# Patient Record
Sex: Female | Born: 1945 | Race: White | Hispanic: No | Smoking: Never smoker
Health system: Southern US, Community
[De-identification: ages and names within clinical notes are randomized; demographics above are authoritative.]

## PROBLEM LIST (undated history)

## (undated) DIAGNOSIS — I10 Essential (primary) hypertension: Secondary | ICD-10-CM

## (undated) DIAGNOSIS — N189 Chronic kidney disease, unspecified: Secondary | ICD-10-CM

## (undated) DIAGNOSIS — K219 Gastro-esophageal reflux disease without esophagitis: Secondary | ICD-10-CM

## (undated) DIAGNOSIS — M1990 Unspecified osteoarthritis, unspecified site: Secondary | ICD-10-CM

## (undated) HISTORY — PX: COLONOSCOPY: SHX174

## (undated) HISTORY — PX: EYE SURGERY: SHX253

## (undated) HISTORY — PX: CHOLECYSTECTOMY: SHX55

## (undated) HISTORY — DX: Essential (primary) hypertension: I10

## (undated) HISTORY — PX: ANKLE SURGERY: SHX546

## (undated) HISTORY — DX: Gastro-esophageal reflux disease without esophagitis: K21.9

## (undated) HISTORY — DX: Unspecified osteoarthritis, unspecified site: M19.90

## (undated) HISTORY — PX: DILATION AND CURETTAGE OF UTERUS: SHX78

---

## 2004-12-21 ENCOUNTER — Ambulatory Visit: Payer: Self-pay | Admitting: Family Medicine

## 2007-07-29 ENCOUNTER — Ambulatory Visit: Payer: Self-pay | Admitting: Physician Assistant

## 2008-01-29 ENCOUNTER — Ambulatory Visit: Payer: Self-pay | Admitting: Gastroenterology

## 2008-03-08 ENCOUNTER — Ambulatory Visit: Payer: Self-pay | Admitting: Family Medicine

## 2008-03-15 ENCOUNTER — Encounter: Payer: Self-pay | Admitting: Family Medicine

## 2008-03-31 ENCOUNTER — Encounter: Payer: Self-pay | Admitting: Family Medicine

## 2008-09-01 ENCOUNTER — Ambulatory Visit: Payer: Self-pay | Admitting: Pain Medicine

## 2008-09-16 ENCOUNTER — Ambulatory Visit: Payer: Self-pay | Admitting: Pain Medicine

## 2008-10-12 ENCOUNTER — Ambulatory Visit: Payer: Self-pay | Admitting: Physician Assistant

## 2008-11-10 ENCOUNTER — Ambulatory Visit: Payer: Self-pay | Admitting: Physician Assistant

## 2011-09-14 ENCOUNTER — Emergency Department: Payer: Self-pay | Admitting: Emergency Medicine

## 2014-04-29 ENCOUNTER — Ambulatory Visit: Payer: Self-pay | Admitting: Internal Medicine

## 2014-10-13 ENCOUNTER — Encounter: Payer: Self-pay | Admitting: *Deleted

## 2014-10-25 ENCOUNTER — Ambulatory Visit: Payer: Self-pay | Admitting: General Surgery

## 2014-11-02 ENCOUNTER — Encounter: Payer: Commercial Managed Care - HMO | Admitting: General Surgery

## 2014-11-02 ENCOUNTER — Encounter: Payer: Self-pay | Admitting: General Surgery

## 2014-11-02 NOTE — Progress Notes (Deleted)
Patient ID: NEESA KEITH, female   DOB: 1946-07-05, 69 y.o.   MRN: [id number]  No chief complaint on file.   HPI MAYUKHA GURULE is a 69 y.o. female.   HPI  No past medical history on file.  No past surgical history on file.  No family history on file.  Social History History  Substance Use Topics  . Smoking status: Not on file  . Smokeless tobacco: Not on file  . Alcohol Use: Not on file    Allergies not on file  Current Outpatient Prescriptions  Medication Sig Dispense Refill  . aspirin 81 MG tablet Take 81 mg by mouth daily.    . calcium-vitamin D 250-100 MG-UNIT per tablet Take 1 tablet by mouth 2 (two) times daily.    Marland Kitchen lisinopril-hydrochlorothiazide (PRINZIDE,ZESTORETIC) 20-25 MG per tablet Take 1 tablet by mouth daily.    . ranitidine (ZANTAC) 300 MG tablet Take 300 mg by mouth at bedtime.     No current facility-administered medications for this visit.    Review of Systems Review of Systems    There were no vitals taken for this visit.  Physical Exam Physical Exam  Data Reviewed   Assessment        Plan           Carson Myrtle 11/02/2014, 11:12 AM

## 2014-11-02 NOTE — Progress Notes (Signed)
Patient ID: Breanna Lawson, female   DOB: July 06, 1946, 69 y.o.   MRN: [id number]

## 2014-11-10 ENCOUNTER — Ambulatory Visit (INDEPENDENT_AMBULATORY_CARE_PROVIDER_SITE_OTHER): Payer: Commercial Managed Care - HMO | Admitting: General Surgery

## 2014-11-10 ENCOUNTER — Encounter: Payer: Self-pay | Admitting: General Surgery

## 2014-11-10 VITALS — BP 130/74 | HR 76 | Resp 16 | Ht 63.0 in | Wt 274.0 lb

## 2014-11-10 DIAGNOSIS — K439 Ventral hernia without obstruction or gangrene: Secondary | ICD-10-CM

## 2014-11-10 DIAGNOSIS — M79604 Pain in right leg: Secondary | ICD-10-CM | POA: Diagnosis not present

## 2014-11-10 NOTE — Progress Notes (Signed)
Patient ID: Breanna Lawson, female   DOB: August 25, 1946, 69 y.o.   MRN: [id number]  Chief Complaint  Patient presents with  . Other    Epigastric hernia    HPI Breanna Lawson is a 69 y.o. female here today for a evaluation of an umbilical hernia. Patient states the area has been there for 5 years but has gotten bigger in recent months. The area does not cause any pain. Patient also states she has been having some abdominal pain after eating and states she feels nauseated. No vomiting. No changes in bowel habits. Patient also complains of pain in her right leg for over a year. The pain occurs when walking and is relieved by lying flat. At times when she is sitting she experiences pain and has to lift the leg up onto a chair.  Patient has noticed some swelling in the leg after standing on her feet for extended periods of time. Last colonoscopy was more than 10 years ago. She states she was supposed to have one in 2012.  HPI  Past Medical History  Diagnosis Date  . Hypertension   . GERD (gastroesophageal reflux disease)   . Arthritis     Past Surgical History  Procedure Laterality Date  . Colonoscopy      more than 10 years ago.   . Ankle surgery Left   . Cholecystectomy      Family History  Problem Relation Age of Onset  . Heart failure Father   . Heart attack Mother   . Breast cancer Sister     Social History History  Substance Use Topics  . Smoking status: Never Smoker   . Smokeless tobacco: Never Used  . Alcohol Use: No    No Known Allergies  Current Outpatient Prescriptions  Medication Sig Dispense Refill  . aspirin 81 MG tablet Take 81 mg by mouth daily.    . calcium-vitamin D 250-100 MG-UNIT per tablet Take 1 tablet by mouth 2 (two) times daily.    Marland Kitchen lisinopril-hydrochlorothiazide (PRINZIDE,ZESTORETIC) 20-25 MG per tablet Take 1 tablet by mouth daily.    . Loperamide HCl (ANTI-DIARRHEAL PO) Take by mouth as needed.    . ranitidine (ZANTAC) 300 MG tablet Take 300 mg  by mouth at bedtime.     No current facility-administered medications for this visit.    Review of Systems Review of Systems  Constitutional: Negative.   Respiratory: Negative.   Cardiovascular: Positive for leg swelling. Negative for chest pain and palpitations.  Gastrointestinal: Positive for nausea and abdominal pain. Negative for vomiting, constipation, blood in stool, abdominal distention, anal bleeding and rectal pain.    Blood pressure 130/74, pulse 76, resp. rate 16, height _0  (1.6 m), weight 274 lb (124.286 kg).  Physical Exam Physical Exam  Constitutional: She is oriented to person, place, and time. She appears well-developed and well-nourished.  Obese white female, finds it difficult to lie flat.  Eyes: Conjunctivae are normal. No scleral icterus.  Neck: Neck supple. No thyromegaly present.  Cardiovascular: Normal rate, regular rhythm, normal heart sounds and intact distal pulses.   Pulses:      Dorsalis pedis pulses are 2+ on the right side, and 2+ on the left side.       Posterior tibial pulses are 2+ on the right side, and 2+ on the left side.  No apparent edema in legs, no varicose veins seen, no skin changes.  Pulmonary/Chest: Effort normal and breath sounds normal.  Abdominal: Soft. Normal appearance and  bowel sounds are normal. There is no tenderness. A hernia is present.    Lymphadenopathy:    She has no cervical adenopathy.  Neurological: She is alert and oriented to person, place, and time.  Skin: Skin is warm and dry.    Data Reviewed None  Assessment    Leg pain likely neuromuscular. Needs orthopedic consult.  Sizable ventral hernia. Patient is at risk for surgery due to weight. Extent of hernia best assessed by CT scan.     Plan    Patient has been scheduled for a CT abdomen/pelvis with contrast at [facility] for 11-16-14 at 11:30 am (arrive 11:15 am). Prep: no solids 4 hours prior but patient may have clear liquids up until  exam time, pick up prep kit, and take medication list. Patient verbalizes understanding.          Payton Moder G 11/11/2014, 5:35 AM

## 2014-11-10 NOTE — Patient Instructions (Addendum)
Schedule for CT to assess ventral hernia.  Patient has been scheduled for a CT abdomen/pelvis with contrast at [facility] for 11-16-14 at 11:30 am (arrive 11:15 am). Prep: no solids 4 hours prior but patient may have clear liquids up until exam time, pick up prep kit, and take medication list. Patient verbalizes understanding.

## 2014-11-11 ENCOUNTER — Encounter: Payer: Self-pay | Admitting: General Surgery

## 2014-11-18 ENCOUNTER — Ambulatory Visit: Payer: Commercial Managed Care - HMO | Admitting: General Surgery

## 2014-11-19 ENCOUNTER — Ambulatory Visit: Payer: Self-pay | Admitting: General Surgery

## 2014-11-19 DIAGNOSIS — K429 Umbilical hernia without obstruction or gangrene: Secondary | ICD-10-CM | POA: Diagnosis not present

## 2014-11-19 DIAGNOSIS — N2889 Other specified disorders of kidney and ureter: Secondary | ICD-10-CM | POA: Diagnosis not present

## 2014-11-19 DIAGNOSIS — K439 Ventral hernia without obstruction or gangrene: Secondary | ICD-10-CM | POA: Diagnosis not present

## 2014-11-22 ENCOUNTER — Encounter: Payer: Self-pay | Admitting: General Surgery

## 2014-11-22 ENCOUNTER — Ambulatory Visit (INDEPENDENT_AMBULATORY_CARE_PROVIDER_SITE_OTHER): Payer: Commercial Managed Care - HMO | Admitting: General Surgery

## 2014-11-22 VITALS — BP 130/72 | HR 74 | Resp 14 | Ht 63.0 in | Wt 274.0 lb

## 2014-11-22 DIAGNOSIS — K439 Ventral hernia without obstruction or gangrene: Secondary | ICD-10-CM

## 2014-11-22 NOTE — Patient Instructions (Addendum)
Patient to return in three months. Discussed weight lost.  Laparoscopic Ventral Hernia Repair Laparoscopic ventral hernia repairis a surgery to fix a ventral hernia. Aventral hernia, also called an incisional hernia, is a bulge of body tissue or intestines that pushes through the front part of the abdomen. This can happen if the connective tissue covering the muscles over the abdomen has a weak spot or is torn because of a surgical cut (incision) from a previous surgery. Laparoscopic ventral hernia repair is often done soon after diagnosis to stop the hernia from getting bigger, becoming uncomfortable, or becoming an emergency. This surgery usually takes about 2 hours, but the time can vary greatly. LET [facility] CARE PROVIDER KNOW ABOUT:  Any allergies you have.  All medicines you are taking, including steroids, vitamins, herbs, eye drops, creams, and over-the-counter medicines.  Previous problems you or members of your family have had with the use of anesthetics.  Any blood disorders you have.  Previous surgeries you have had.  Medical conditions you have. RISKS AND COMPLICATIONS  Generally, laparoscopic ventral hernia repair is a safe procedure. However, as with any surgical procedure, problems can occur. Possible problems include:  Bleeding.  Trouble passing urine or having a bowel movement after the surgery.  Infection.  Pneumonia.  Blood clots.  Pain in the area of the hernia.  A bulge in the area of the hernia that may be caused by a collection of fluid.  Injury to intestines or other structures in the abdomen.  Return of the hernia after surgery. In some cases, your health care provider may need to stop the laparoscopic procedure and do regular, open surgery. This may be necessary for very difficult hernias, when organs are hard to see, or when bleeding problems occur during surgery. BEFORE THE PROCEDURE   You may need to have blood tests, urine tests, a chest  X-ray, or an electrocardiogram done before the day of the surgery.  Ask your health care provider about changing or stopping your regular medicines. This is especially important if you are taking diabetes medicines or blood thinners.  You may need to wash with a special type of germ-killing soap.  Do not eat or drink anything after midnight the night before the procedure or as directed by your health care provider.  Make plans to have someone drive you home after the procedure. PROCEDURE   Small monitors will be put on your body. They are used to check your heart, blood pressure, and oxygen level.  An IV access tube will be put into a vein in your hand or arm. Fluids and medicine will flow directly into your body through the IV tube.  You will be given medicine that makes you go to sleep (general anesthetic).  Your abdomen will be cleaned with a special soap to kill any germs on your skin.  Once you are asleep, several small incisions will be made in your abdomen.  The large space in your abdomen will be filled with air so that it expands. This gives your health care provider more room and a better view.  A thin, lighted tube with a tiny camera on the end (laparoscope) is put through a small incision in your abdomen. The camera on the laparoscope sends a picture to a TV screen in the operating room. This gives your health care provider a good view inside your abdomen.  Hollow tubes are put through the other small incisions in your abdomen. The tools needed for the procedure  are put through these tubes.  Your health care provider puts the tissue or intestines that formed the hernia back in place.  A screen-like patch (mesh) is used to close the hernia. This helps make the area stronger. Stitches, tacks, or staples are used to keep the mesh in place.  Medicine and a bandage (dressing) or skin glue will be put over the incisions. AFTER THE PROCEDURE   You will stay in a recovery area  until the anesthetic wears off. Your blood pressure and pulse will be checked often.  You may be able to go home the same day or may need to stay in the hospital for 1-2 days after surgery. Your health care provider will decide when you can go home.  You may feel some pain. You may be given medicine for pain.  You will be urged to do breathing exercises that involve taking deep breaths. This helps prevent a lung infection after a surgery.  You may have to wear compression stockings while you are in the hospital. These stockings help keep blood clots from forming in your legs. Document Released: 09/03/2012 Document Revised: 09/22/2013 Document Reviewed: 09/03/2012 [facility] Patient Information 2015 [place], [place]. This information is not intended to replace advice given to you by your health care provider. Make sure you discuss any questions you have with your health care provider.

## 2014-11-22 NOTE — Progress Notes (Signed)
This is a 69 year old female here today for her results from her CT scan that was done on 11/19/14.  CT scan was reviewed. There are 2 5cm diuam fascial defects in central abdomen-above and below umbilicus, this is a narrow bridge of fascia, There is a loop of tr colon in upper hernia and fat in lower hernia. Overall she has a 10 by 5-6cm fascial defect.  Pt weighs 275lbs. Exam shoiwd the 2 hernias basically unchanged from last eval. Abd is soft, nontender. Based on exam and CT findings best way to repair the hernia is with planned component separatin and retrorectus mesh placement.  This is an involved procedure and pt's weight is a siginificant ris factor. At present she has no obsructive symptoms. Discussed in detail with pt and advised  On need to lose wight.  Plan is to follow periodically while she is losing weight. She can discuss with Dr. Lavera Guise regarding an adequate wt loss regimen.   All of the above dicussed in full with pt.   Patient to return in three months.   Cc: PCP

## 2014-12-16 DIAGNOSIS — M1990 Unspecified osteoarthritis, unspecified site: Secondary | ICD-10-CM | POA: Diagnosis not present

## 2014-12-16 DIAGNOSIS — E669 Obesity, unspecified: Secondary | ICD-10-CM | POA: Diagnosis not present

## 2015-01-17 DIAGNOSIS — M5431 Sciatica, right side: Secondary | ICD-10-CM | POA: Diagnosis not present

## 2015-01-17 DIAGNOSIS — K439 Ventral hernia without obstruction or gangrene: Secondary | ICD-10-CM | POA: Diagnosis not present

## 2015-01-17 DIAGNOSIS — M79606 Pain in leg, unspecified: Secondary | ICD-10-CM | POA: Diagnosis not present

## 2015-01-17 DIAGNOSIS — E780 Pure hypercholesterolemia: Secondary | ICD-10-CM | POA: Diagnosis not present

## 2015-01-25 DIAGNOSIS — M5417 Radiculopathy, lumbosacral region: Secondary | ICD-10-CM | POA: Diagnosis not present

## 2015-01-25 DIAGNOSIS — M4696 Unspecified inflammatory spondylopathy, lumbar region: Secondary | ICD-10-CM | POA: Diagnosis not present

## 2015-02-24 ENCOUNTER — Encounter: Payer: Self-pay | Admitting: General Surgery

## 2015-02-24 ENCOUNTER — Ambulatory Visit (INDEPENDENT_AMBULATORY_CARE_PROVIDER_SITE_OTHER): Payer: Commercial Managed Care - HMO | Admitting: General Surgery

## 2015-02-24 VITALS — BP 142/80 | HR 68 | Resp 18 | Ht 63.0 in | Wt 258.0 lb

## 2015-02-24 DIAGNOSIS — K439 Ventral hernia without obstruction or gangrene: Secondary | ICD-10-CM | POA: Diagnosis not present

## 2015-02-24 NOTE — Patient Instructions (Addendum)
The patient is aware to call back for any questions or concerns.  Ventral Hernia A ventral hernia (also called an incisional hernia) is a hernia that occurs at the site of a previous surgical cut (incision) in the abdomen. The abdominal wall spans from your lower chest down to your pelvis. If the abdominal wall is weakened from a surgical incision, a hernia can occur. A hernia is a bulge of bowel or muscle tissue pushing out on the weakened part of the abdominal wall. Ventral hernias can get bigger from straining or lifting. Obese and older people are at higher risk for a ventral hernia. People who develop infections after surgery or require repeat incisions at the same site on the abdomen are also at increased risk. CAUSES  A ventral hernia occurs because of weakness in the abdominal wall at an incision site.  SYMPTOMS  Common symptoms include:  A visible bulge or lump on the abdominal wall.  Pain or tenderness around the lump.  Increased discomfort if you cough or make a sudden movement. If the hernia has blocked part of the intestine, a serious complication can occur (incarcerated or strangulated hernia). This can become a problem that requires emergency surgery because the blood flow to the blocked intestine may be cut off. Symptoms may include:  Feeling sick to your stomach (nauseous).  Throwing up (vomiting).  Stomach swelling (distention) or bloating.  Fever.  Rapid heartbeat. DIAGNOSIS  Your health care provider will take a medical history and perform a physical exam. Various tests may be ordered, such as:  Blood tests.  Urine tests.  Ultrasonography.  X-rays.  Computed tomography (CT). TREATMENT  Watchful waiting may be all that is needed for a smaller hernia that does not cause symptoms. Your health care provider may recommend the use of a supportive belt (truss) that helps to keep the abdominal wall intact. For larger hernias or those that cause pain, surgery to  repair the hernia is usually recommended. If a hernia becomes strangulated, emergency surgery needs to be done right away. HOME CARE INSTRUCTIONS  Avoid putting pressure or strain on the abdominal area.  Avoid heavy lifting.  Use good body positioning for physical tasks. Ask your health care provider about proper body positioning.  Use a supportive belt as directed by your health care provider.  Maintain a healthy weight.  Eat foods that are high in fiber, such as whole grains, fruits, and vegetables. Fiber helps prevent difficult bowel movements (constipation).  Drink enough fluids to keep your urine clear or pale yellow.  Follow up with your health care provider as directed. SEEK MEDICAL CARE IF:   Your hernia seems to be getting larger or more painful. SEEK IMMEDIATE MEDICAL CARE IF:   You have abdominal pain that is sudden and sharp.  Your pain becomes severe.  You have repeated vomiting.  You are sweating a lot.  You notice a rapid heartbeat.  You develop a fever. MAKE SURE YOU:   Understand these instructions.  Will watch your condition.  Will get help right away if you are not doing well or get worse. Document Released: 09/03/2012 Document Revised: 02/01/2014 Document Reviewed: 09/03/2012 [facility] Patient Information 2015 [place], [place]. This information is not intended to replace advice given to you by your health care provider. Make sure you discuss any questions you have with your health care provider.  Lab work today.   Will call patient to arrange surgery once Dr. Jamal Collin reviews labs.

## 2015-02-24 NOTE — Progress Notes (Signed)
Patient ID: Breanna Lawson, female   DOB: 09-19-1946, 69 y.o.   MRN: [id number]  Chief Complaint  Patient presents with  . Follow-up    ventral hernia    HPI Breanna Lawson is a 69 y.o. female here today for her three month follow up ventral hernia. Patient states she has occasional pains maybe once or twice a week.  She is wearing her back brace. She has lost 19 pounds according to her scales since last visit.  HPI  Past Medical History  Diagnosis Date  . Hypertension   . GERD (gastroesophageal reflux disease)   . Arthritis     Past Surgical History  Procedure Laterality Date  . Colonoscopy      more than 10 years ago.   . Ankle surgery Left   . Cholecystectomy      Family History  Problem Relation Age of Onset  . Heart failure Father   . Heart attack Mother   . Breast cancer Sister     Social History History  Substance Use Topics  . Smoking status: Never Smoker   . Smokeless tobacco: Never Used  . Alcohol Use: No    No Known Allergies  Current Outpatient Prescriptions  Medication Sig Dispense Refill  . aspirin 81 MG tablet Take 81 mg by mouth daily.    . calcium-vitamin D 250-100 MG-UNIT per tablet Take 1 tablet by mouth 2 (two) times daily.    Marland Kitchen lisinopril-hydrochlorothiazide (PRINZIDE,ZESTORETIC) 20-25 MG per tablet Take 1 tablet by mouth daily.    . Loperamide HCl (ANTI-DIARRHEAL PO) Take by mouth as needed.    . meloxicam (MOBIC) 15 MG tablet     . ranitidine (ZANTAC) 300 MG tablet Take 300 mg by mouth at bedtime.     No current facility-administered medications for this visit.    Review of Systems Review of Systems  Constitutional: Negative.   Respiratory: Negative.   Cardiovascular: Negative.   Gastrointestinal: Negative for diarrhea and constipation.    Blood pressure 142/80, pulse 68, resp. rate 18, height 5' 3"  (1.6 m), weight 258 lb (117.028 kg).  Physical Exam Physical Exam  Constitutional: She is oriented to person, place, and time.  She appears well-developed and well-nourished.  Eyes: Conjunctivae are normal. No scleral icterus.  Neck: Neck supple.  Cardiovascular: Normal rate, regular rhythm and normal heart sounds.   Pulmonary/Chest: Effort normal and breath sounds normal.  Abdominal: Soft. Normal appearance. There is no tenderness. A hernia is present. Hernia confirmed positive in the ventral area.  2 Hernias present ventral 5 x 6 cm each  Lymphadenopathy:    She has no cervical adenopathy.  Neurological: She is alert and oriented to person, place, and time.  Skin: Skin is warm and dry.    Data Reviewed Office notes.  Assessment    Ventral hernia Non reducible. Pt has lost weight as recommended. Will check labs and if normal will schedule repir of the hernias with component separation. This was explained in full to pt.    Plan    Lab work today,  CBC Met C   Hernia precautions and incarceration were discussed with the patient. If they develop symptoms of an incarcerated hernia, they were encouraged to seek prompt medical attention.  I have recommended repair of the hernia using mesh on an outpatient basis in the near future. The risk of infection was reviewed. The role of prosthetic mesh to minimize the risk of recurrence was reviewed.      Patient's  surgery to be arranged after lab work reviewed.  PCP:  Gretta Arab 02/24/2015, 11:10 AM

## 2015-02-25 LAB — COMPREHENSIVE METABOLIC PANEL
A/G RATIO: 1.3 (ref 1.1–2.5)
ALBUMIN: 4 g/dL (ref 3.6–4.8)
ALT: 18 IU/L (ref 0–32)
AST: 20 IU/L (ref 0–40)
Alkaline Phosphatase: 114 IU/L (ref 39–117)
BUN/Creatinine Ratio: 15 (ref 11–26)
BUN: 22 mg/dL (ref 8–27)
Bilirubin Total: 0.2 mg/dL (ref 0.0–1.2)
CALCIUM: 10 mg/dL (ref 8.7–10.3)
CO2: 24 mmol/L (ref 18–29)
Chloride: 103 mmol/L (ref 97–108)
Creatinine, Ser: 1.45 mg/dL — ABNORMAL HIGH (ref 0.57–1.00)
GFR calc Af Amer: 43 mL/min/{1.73_m2} — ABNORMAL LOW (ref >59)
GFR, EST NON AFRICAN AMERICAN: 38 mL/min/{1.73_m2} — AB (ref >59)
GLUCOSE: 95 mg/dL (ref 65–99)
Globulin, Total: 3.1 g/dL (ref 1.5–4.5)
POTASSIUM: 5.1 mmol/L (ref 3.5–5.2)
Sodium: 144 mmol/L (ref 134–144)
TOTAL PROTEIN: 7.1 g/dL (ref 6.0–8.5)

## 2015-02-25 LAB — CBC WITH DIFFERENTIAL/PLATELET
BASOS: 0 %
Basophils Absolute: 0 10*3/uL (ref 0.0–0.2)
EOS (ABSOLUTE): 0.1 10*3/uL (ref 0.0–0.4)
Eos: 1 %
HEMATOCRIT: 37.3 % (ref 34.0–46.6)
HEMOGLOBIN: 12.1 g/dL (ref 11.1–15.9)
Immature Grans (Abs): 0 10*3/uL (ref 0.0–0.1)
Immature Granulocytes: 0 %
Lymphocytes Absolute: 1.9 10*3/uL (ref 0.7–3.1)
Lymphs: 21 %
MCH: 27.8 pg (ref 26.6–33.0)
MCHC: 32.4 g/dL (ref 31.5–35.7)
MCV: 86 fL (ref 79–97)
MONOCYTES: 7 %
MONOS ABS: 0.7 10*3/uL (ref 0.1–0.9)
NEUTROS ABS: 6.6 10*3/uL (ref 1.4–7.0)
Neutrophils: 71 %
Platelets: 310 10*3/uL (ref 150–379)
RBC: 4.35 x10E6/uL (ref 3.77–5.28)
RDW: 14.3 % (ref 12.3–15.4)
WBC: 9.4 10*3/uL (ref 3.4–10.8)

## 2015-03-01 ENCOUNTER — Telehealth: Payer: Self-pay

## 2015-03-01 NOTE — Telephone Encounter (Signed)
Spoke with patient about seeing a nephrologist for clearance for surgery due to low GFR and elevated Creatinine. Patient is scheduled to see Dr Juleen China at [facility] on 03/14/15 at 1:30 pm. She will call back to schedule her surgery once she speaks with her son. Patient is aware of date and time.

## 2015-03-02 ENCOUNTER — Telehealth: Payer: Self-pay

## 2015-03-02 NOTE — Telephone Encounter (Signed)
Spoke with patient about scheduling her surgery. Patient is scheduled for surgery at [facility] on 03/25/15. She will pre admit at the hospital on 03/16/15 at 11:00 am. Paperwork for this has been mailed to her. The patient is aware of dates, time, and instructions.

## 2015-03-08 ENCOUNTER — Other Ambulatory Visit: Payer: Self-pay | Admitting: General Surgery

## 2015-03-08 DIAGNOSIS — K436 Other and unspecified ventral hernia with obstruction, without gangrene: Secondary | ICD-10-CM

## 2015-03-14 DIAGNOSIS — N2581 Secondary hyperparathyroidism of renal origin: Secondary | ICD-10-CM | POA: Diagnosis not present

## 2015-03-14 DIAGNOSIS — N179 Acute kidney failure, unspecified: Secondary | ICD-10-CM | POA: Diagnosis not present

## 2015-03-14 DIAGNOSIS — N183 Chronic kidney disease, stage 3 (moderate): Secondary | ICD-10-CM | POA: Diagnosis not present

## 2015-03-14 DIAGNOSIS — I10 Essential (primary) hypertension: Secondary | ICD-10-CM | POA: Diagnosis not present

## 2015-03-15 ENCOUNTER — Other Ambulatory Visit: Payer: Self-pay | Admitting: Nephrology

## 2015-03-15 DIAGNOSIS — N183 Chronic kidney disease, stage 3 (moderate): Secondary | ICD-10-CM

## 2015-03-16 ENCOUNTER — Encounter
Admission: RE | Admit: 2015-03-16 | Discharge: 2015-03-16 | Disposition: A | Discharge status: Home / Self Care | Payer: Commercial Managed Care - HMO | Source: Ambulatory Visit | Attending: General Surgery | Admitting: General Surgery

## 2015-03-16 DIAGNOSIS — I10 Essential (primary) hypertension: Secondary | ICD-10-CM | POA: Diagnosis not present

## 2015-03-16 DIAGNOSIS — N183 Chronic kidney disease, stage 3 (moderate): Secondary | ICD-10-CM | POA: Diagnosis not present

## 2015-03-16 HISTORY — DX: Chronic kidney disease, unspecified: N18.9

## 2015-03-16 LAB — POTASSIUM: POTASSIUM: 4.8 mmol/L (ref 3.5–5.1)

## 2015-03-16 NOTE — Patient Instructions (Signed)
  Your procedure is scheduled on: @March 25, 2015 (Friday) Report to Day Surgery. To find out your arrival time please call [phone] between 1PM - 3PM on March 24, 2015 (Thursday).  Remember: Instructions that are not followed completely may result in serious medical risk, up to and including death, or upon the discretion of your surgeon and anesthesiologist your surgery may need to be rescheduled.    __x_ 1. Do not eat food or drink liquids after midnight. No gum chewing or hard candies.     ____ 2. No Alcohol for 24 hours before or after surgery.   ____ 3. Bring all medications with you on the day of surgery if instructed.    __x__ 4. Notify your doctor if there is any change in your medical condition     (cold, fever, infections).     Do not wear jewelry, make-up, hairpins, clips or nail polish.  Do not wear lotions, powders, or perfumes. You may wear deodorant.  Do not shave 48 hours prior to surgery. Men may shave face and neck.  Do not bring valuables to the hospital.    [facility] is not responsible for any belongings or valuables.               Contacts, dentures or bridgework may not be worn into surgery.  Leave your suitcase in the car. After surgery it may be brought to your room.  For patients admitted to the hospital, discharge time is determined by your                treatment team.   Patients discharged the day of surgery will not be allowed to drive home.   Please read over the following fact sheets that you were given:   Surgical Site Infection Prevention   ____ Take these medicines the morning of surgery with A SIP OF WATER:    1. Ranitidine (Zantac)  2. Ranitidine at bedtime on Thursday night also.  3.   4.  5.  6.  ____ Fleet Enema (as directed)   __x__ Use CHG Soap as directed  ____ Use inhalers on the day of surgery  ____ Stop metformin 2 days prior to surgery    ____ Take 1/2 of usual insulin dose the night before surgery and none on the  morning of surgery.   __x__ Stop Coumadin/Plavix/aspirin on (Ask Dr. Jamal Collin office about stopping aspirin)  ____ Stop Anti-inflammatories on    ____ Stop supplements until after surgery.    ____ Bring C-Pap to the hospital.

## 2015-03-17 ENCOUNTER — Ambulatory Visit
Admission: RE | Admit: 2015-03-17 | Discharge: 2015-03-17 | Disposition: A | Discharge status: Home / Self Care | Payer: Commercial Managed Care - HMO | Source: Ambulatory Visit | Attending: Nephrology | Admitting: Nephrology

## 2015-03-17 DIAGNOSIS — N183 Chronic kidney disease, stage 3 (moderate): Secondary | ICD-10-CM | POA: Insufficient documentation

## 2015-03-21 DIAGNOSIS — D631 Anemia in chronic kidney disease: Secondary | ICD-10-CM | POA: Diagnosis not present

## 2015-03-21 DIAGNOSIS — N2581 Secondary hyperparathyroidism of renal origin: Secondary | ICD-10-CM | POA: Diagnosis not present

## 2015-03-21 DIAGNOSIS — I10 Essential (primary) hypertension: Secondary | ICD-10-CM | POA: Diagnosis not present

## 2015-03-21 DIAGNOSIS — N183 Chronic kidney disease, stage 3 (moderate): Secondary | ICD-10-CM | POA: Diagnosis not present

## 2015-03-25 ENCOUNTER — Encounter
Admission: RE | Disposition: A | Discharge status: Skilled Nursing Facility | Payer: Self-pay | Source: Ambulatory Visit | Attending: General Surgery

## 2015-03-25 ENCOUNTER — Inpatient Hospital Stay: Payer: Commercial Managed Care - HMO | Admitting: Anesthesiology

## 2015-03-25 ENCOUNTER — Inpatient Hospital Stay
Admission: RE | Admit: 2015-03-25 | Discharge: 2015-03-29 | DRG: 355 | Disposition: A | Discharge status: Skilled Nursing Facility | Payer: Commercial Managed Care - HMO | Source: Ambulatory Visit | Attending: General Surgery | Admitting: General Surgery

## 2015-03-25 DIAGNOSIS — Z8249 Family history of ischemic heart disease and other diseases of the circulatory system: Secondary | ICD-10-CM

## 2015-03-25 DIAGNOSIS — Z7982 Long term (current) use of aspirin: Secondary | ICD-10-CM

## 2015-03-25 DIAGNOSIS — M6281 Muscle weakness (generalized): Secondary | ICD-10-CM | POA: Diagnosis not present

## 2015-03-25 DIAGNOSIS — N189 Chronic kidney disease, unspecified: Secondary | ICD-10-CM | POA: Diagnosis present

## 2015-03-25 DIAGNOSIS — E669 Obesity, unspecified: Secondary | ICD-10-CM | POA: Diagnosis not present

## 2015-03-25 DIAGNOSIS — K439 Ventral hernia without obstruction or gangrene: Principal | ICD-10-CM | POA: Diagnosis present

## 2015-03-25 DIAGNOSIS — K219 Gastro-esophageal reflux disease without esophagitis: Secondary | ICD-10-CM | POA: Diagnosis not present

## 2015-03-25 DIAGNOSIS — I129 Hypertensive chronic kidney disease with stage 1 through stage 4 chronic kidney disease, or unspecified chronic kidney disease: Secondary | ICD-10-CM | POA: Diagnosis not present

## 2015-03-25 DIAGNOSIS — Z803 Family history of malignant neoplasm of breast: Secondary | ICD-10-CM

## 2015-03-25 DIAGNOSIS — K660 Peritoneal adhesions (postprocedural) (postinfection): Secondary | ICD-10-CM | POA: Diagnosis present

## 2015-03-25 DIAGNOSIS — M1990 Unspecified osteoarthritis, unspecified site: Secondary | ICD-10-CM | POA: Diagnosis not present

## 2015-03-25 DIAGNOSIS — Z48815 Encounter for surgical aftercare following surgery on the digestive system: Secondary | ICD-10-CM | POA: Diagnosis not present

## 2015-03-25 DIAGNOSIS — Z791 Long term (current) use of non-steroidal anti-inflammatories (NSAID): Secondary | ICD-10-CM

## 2015-03-25 DIAGNOSIS — I10 Essential (primary) hypertension: Secondary | ICD-10-CM | POA: Diagnosis not present

## 2015-03-25 DIAGNOSIS — K432 Incisional hernia without obstruction or gangrene: Secondary | ICD-10-CM | POA: Diagnosis not present

## 2015-03-25 DIAGNOSIS — K436 Other and unspecified ventral hernia with obstruction, without gangrene: Secondary | ICD-10-CM

## 2015-03-25 HISTORY — PX: INSERTION OF MESH: SHX5868

## 2015-03-25 HISTORY — PX: VENTRAL HERNIA REPAIR: SHX424

## 2015-03-25 LAB — CBC
HEMATOCRIT: 35.7 % (ref 35.0–47.0)
Hemoglobin: 11.4 g/dL — ABNORMAL LOW (ref 12.0–16.0)
MCH: 27.8 pg (ref 26.0–34.0)
MCHC: 32 g/dL (ref 32.0–36.0)
MCV: 87 fL (ref 80.0–100.0)
PLATELETS: 260 10*3/uL (ref 150–440)
RBC: 4.1 MIL/uL (ref 3.80–5.20)
RDW: 14.5 % (ref 11.5–14.5)
WBC: 17.5 10*3/uL — ABNORMAL HIGH (ref 3.6–11.0)

## 2015-03-25 LAB — CREATININE, SERUM
Creatinine, Ser: 1.62 mg/dL — ABNORMAL HIGH (ref 0.44–1.00)
GFR, EST AFRICAN AMERICAN: 37 mL/min — AB (ref >60)
GFR, EST NON AFRICAN AMERICAN: 32 mL/min — AB (ref >60)

## 2015-03-25 SURGERY — REPAIR, HERNIA, VENTRAL
Anesthesia: General | Wound class: Clean Contaminated

## 2015-03-25 MED ORDER — ONDANSETRON HCL 4 MG/2ML IJ SOLN
INTRAMUSCULAR | Status: DC | PRN
  Administered 2015-03-25: 4 mg via INTRAVENOUS

## 2015-03-25 MED ORDER — ONDANSETRON HCL 4 MG/2ML IJ SOLN
4.0000 mg | INTRAMUSCULAR | Status: DC | PRN
Start: 2015-03-25 — End: 2015-03-29
  Administered 2015-03-25: 4 mg via INTRAVENOUS
  Filled 2015-03-25: qty 2

## 2015-03-25 MED ORDER — CEFAZOLIN SODIUM-DEXTROSE 2-3 GM-% IV SOLR
INTRAVENOUS | Status: AC
  Administered 2015-03-25: 07:00:00
  Filled 2015-03-25: qty 50

## 2015-03-25 MED ORDER — CHLORHEXIDINE GLUCONATE 4 % EX LIQD: 1.0000 "application " | Freq: Once | CUTANEOUS | Status: DC

## 2015-03-25 MED ORDER — FENTANYL CITRATE (PF) 100 MCG/2ML IJ SOLN
25.0000 ug | INTRAMUSCULAR | Status: DC | PRN
  Administered 2015-03-25 (×4): 25 ug via INTRAVENOUS

## 2015-03-25 MED ORDER — ASPIRIN 81 MG PO CHEW
81.0000 mg | CHEWABLE_TABLET | Freq: Every day | ORAL | Status: DC
Start: 2015-03-25 — End: 2015-03-29
  Administered 2015-03-25 – 2015-03-29 (×5): 81 mg via ORAL
  Filled 2015-03-25 (×5): qty 1

## 2015-03-25 MED ORDER — LISINOPRIL-HYDROCHLOROTHIAZIDE 20-25 MG PO TABS: 1.0000 | ORAL_TABLET | Freq: Every day | ORAL | Status: DC

## 2015-03-25 MED ORDER — LIDOCAINE HCL (PF) 1 % IJ SOLN
INTRAMUSCULAR | Status: AC
  Filled 2015-03-25: qty 30

## 2015-03-25 MED ORDER — FENTANYL CITRATE (PF) 100 MCG/2ML IJ SOLN
INTRAMUSCULAR | Status: DC | PRN
  Administered 2015-03-25 (×2): 25 ug via INTRAVENOUS
  Administered 2015-03-25: 50 ug via INTRAVENOUS

## 2015-03-25 MED ORDER — HYDROCHLOROTHIAZIDE 25 MG PO TABS
25.0000 mg | ORAL_TABLET | Freq: Every day | ORAL | Status: DC
  Administered 2015-03-25 – 2015-03-29 (×5): 25 mg via ORAL
  Filled 2015-03-25 (×5): qty 1

## 2015-03-25 MED ORDER — BUPIVACAINE HCL (PF) 0.5 % IJ SOLN
INTRAMUSCULAR | Status: AC
  Filled 2015-03-25: qty 30

## 2015-03-25 MED ORDER — KETOROLAC TROMETHAMINE 30 MG/ML IJ SOLN
INTRAMUSCULAR | Status: DC | PRN
  Administered 2015-03-25: 15 mg via INTRAVENOUS

## 2015-03-25 MED ORDER — EPHEDRINE SULFATE 50 MG/ML IJ SOLN
INTRAMUSCULAR | Status: DC | PRN
  Administered 2015-03-25: 10 mg via INTRAVENOUS
  Administered 2015-03-25: 5 mg via INTRAVENOUS

## 2015-03-25 MED ORDER — MORPHINE SULFATE 2 MG/ML IJ SOLN
2.0000 mg | INTRAMUSCULAR | Status: DC | PRN
  Administered 2015-03-25 – 2015-03-26 (×4): 2 mg via INTRAVENOUS
  Filled 2015-03-25 (×4): qty 1

## 2015-03-25 MED ORDER — MIDAZOLAM HCL 2 MG/2ML IJ SOLN
INTRAMUSCULAR | Status: DC | PRN
  Administered 2015-03-25: 2 mg via INTRAVENOUS

## 2015-03-25 MED ORDER — FAMOTIDINE 20 MG PO TABS
20.0000 mg | ORAL_TABLET | Freq: Every day | ORAL | Status: DC
  Administered 2015-03-25 – 2015-03-27 (×3): 20 mg via ORAL
  Filled 2015-03-25 (×3): qty 1

## 2015-03-25 MED ORDER — PROPOFOL 10 MG/ML IV BOLUS
INTRAVENOUS | Status: DC | PRN
  Administered 2015-03-25: 140 mg via INTRAVENOUS
  Administered 2015-03-25: 30 mg via INTRAVENOUS

## 2015-03-25 MED ORDER — DEXTROSE-NACL 5-0.45 % IV SOLN
INTRAVENOUS | Status: DC
  Administered 2015-03-25 – 2015-03-26 (×3): via INTRAVENOUS

## 2015-03-25 MED ORDER — LISINOPRIL 20 MG PO TABS
20.0000 mg | ORAL_TABLET | Freq: Every day | ORAL | Status: DC
  Administered 2015-03-25 – 2015-03-29 (×4): 20 mg via ORAL
  Filled 2015-03-25 (×5): qty 1

## 2015-03-25 MED ORDER — PHENYLEPHRINE HCL 10 MG/ML IJ SOLN
INTRAMUSCULAR | Status: DC | PRN
  Administered 2015-03-25 (×6): 100 ug via INTRAVENOUS

## 2015-03-25 MED ORDER — ROCURONIUM BROMIDE 100 MG/10ML IV SOLN
INTRAVENOUS | Status: DC | PRN
  Administered 2015-03-25: 30 mg via INTRAVENOUS
  Administered 2015-03-25: 20 mg via INTRAVENOUS

## 2015-03-25 MED ORDER — NEOSTIGMINE METHYLSULFATE 10 MG/10ML IV SOLN
INTRAVENOUS | Status: DC | PRN
  Administered 2015-03-25: 3 mg via INTRAVENOUS

## 2015-03-25 MED ORDER — ENOXAPARIN SODIUM 30 MG/0.3ML ~~LOC~~ SOLN: 30.0000 mg | SUBCUTANEOUS | Status: DC

## 2015-03-25 MED ORDER — ACETAMINOPHEN 10 MG/ML IV SOLN
INTRAVENOUS | Status: DC | PRN
  Administered 2015-03-25: 1000 mg via INTRAVENOUS

## 2015-03-25 MED ORDER — OXYCODONE HCL 5 MG PO TABS
5.0000 mg | ORAL_TABLET | ORAL | Status: DC | PRN
  Administered 2015-03-27 – 2015-03-29 (×6): 5 mg via ORAL
  Filled 2015-03-25 (×6): qty 1

## 2015-03-25 MED ORDER — LACTATED RINGERS IV SOLN
INTRAVENOUS | Status: DC
  Administered 2015-03-25 (×2): via INTRAVENOUS

## 2015-03-25 MED ORDER — CALCIUM CARBONATE-VITAMIN D 500-200 MG-UNIT PO TABS
1.0000 | ORAL_TABLET | Freq: Every day | ORAL | Status: DC
  Administered 2015-03-25 – 2015-03-29 (×5): 1 via ORAL
  Filled 2015-03-25 (×5): qty 1

## 2015-03-25 MED ORDER — LIDOCAINE HCL (CARDIAC) 20 MG/ML IV SOLN
INTRAVENOUS | Status: DC | PRN
  Administered 2015-03-25: 80 mg via INTRAVENOUS

## 2015-03-25 MED ORDER — ACETAMINOPHEN 10 MG/ML IV SOLN
INTRAVENOUS | Status: AC
  Filled 2015-03-25: qty 100

## 2015-03-25 MED ORDER — ACETAMINOPHEN 650 MG RE SUPP: 650.0000 mg | Freq: Four times a day (QID) | RECTAL | Status: DC | PRN

## 2015-03-25 MED ORDER — DEXAMETHASONE SODIUM PHOSPHATE 4 MG/ML IJ SOLN
INTRAMUSCULAR | Status: DC | PRN
  Administered 2015-03-25: 5 mg via INTRAVENOUS

## 2015-03-25 MED ORDER — ONDANSETRON HCL 4 MG/2ML IJ SOLN: 4.0000 mg | Freq: Once | INTRAMUSCULAR | Status: DC | PRN

## 2015-03-25 MED ORDER — GLYCOPYRROLATE 0.2 MG/ML IJ SOLN
INTRAMUSCULAR | Status: DC | PRN
  Administered 2015-03-25: 0.6 mg via INTRAVENOUS

## 2015-03-25 MED ORDER — ACETAMINOPHEN 325 MG PO TABS: 650.0000 mg | ORAL_TABLET | ORAL | Status: DC | PRN

## 2015-03-25 MED ORDER — HYDROMORPHONE HCL 1 MG/ML IJ SOLN: 0.2500 mg | INTRAMUSCULAR | Status: DC | PRN

## 2015-03-25 MED ORDER — CEFAZOLIN SODIUM-DEXTROSE 2-3 GM-% IV SOLR
2.0000 g | INTRAVENOUS | Status: AC
  Administered 2015-03-25: 2 g via INTRAVENOUS

## 2015-03-25 MED ORDER — FENTANYL CITRATE (PF) 100 MCG/2ML IJ SOLN
INTRAMUSCULAR | Status: AC
  Administered 2015-03-25: 25 ug via INTRAVENOUS
  Filled 2015-03-25: qty 2

## 2015-03-25 SURGICAL SUPPLY — 42 items
BINDER ABDOMINAL 12 ML 46-62 (SOFTGOODS) ×3 IMPLANT
BULB RESERV EVAC DRAIN JP 100C (MISCELLANEOUS) ×6 IMPLANT
CANISTER SUCT 1200ML W/VALVE (MISCELLANEOUS) ×3 IMPLANT
CHLORAPREP W/TINT 26ML (MISCELLANEOUS) ×3 IMPLANT
CLOSURE WOUND 1/2 X4 (GAUZE/BANDAGES/DRESSINGS) ×1
DECANTER SPIKE VIAL GLASS SM (MISCELLANEOUS) ×6 IMPLANT
DRAIN CHANNEL JP 15F RND 16 (MISCELLANEOUS) ×6 IMPLANT
DRAPE INCISE IOBAN 66X45 STRL (DRAPES) ×3 IMPLANT
DRAPE LAPAROTOMY 100X77 ABD (DRAPES) ×3 IMPLANT
DRESSING TELFA 4X3 1S ST N-ADH (GAUZE/BANDAGES/DRESSINGS) ×3 IMPLANT
DRSG OPSITE POSTOP 4X12 (GAUZE/BANDAGES/DRESSINGS) ×3 IMPLANT
DRSG TEGADERM 4X4.75 (GAUZE/BANDAGES/DRESSINGS) ×3 IMPLANT
ETHILON SUTURE ×3 IMPLANT
GLOVE BIO SURGEON STRL SZ7 (GLOVE) ×30 IMPLANT
GOWN STRL REUS W/ TWL LRG LVL3 (GOWN DISPOSABLE) ×5 IMPLANT
GOWN STRL REUS W/TWL LRG LVL3 (GOWN DISPOSABLE) ×10
KIT RM TURNOVER STRD PROC AR (KITS) ×3 IMPLANT
LABEL OR SOLS (LABEL) ×3 IMPLANT
LIQUID BAND (GAUZE/BANDAGES/DRESSINGS) ×3 IMPLANT
MESH HERNIA 10X14 SHEET (Mesh General) ×3 IMPLANT
NDL HPO THNWL 1X22GA REG BVL (NEEDLE) ×1 IMPLANT
NDL SAFETY 25GX1.5 (NEEDLE) ×6 IMPLANT
NEEDLE SAFETY 22GX1 (NEEDLE) ×2
NS IRRIG 500ML POUR BTL (IV SOLUTION) ×6 IMPLANT
PACK BASIN MINOR ARMC (MISCELLANEOUS) ×3 IMPLANT
PAD GROUND ADULT SPLIT (MISCELLANEOUS) ×3 IMPLANT
RETAINER VISCERA MED (MISCELLANEOUS) ×3 IMPLANT
SPONGE LAP 18X18 5 PK (GAUZE/BANDAGES/DRESSINGS) ×3 IMPLANT
STRIP CLOSURE SKIN 1/2X4 (GAUZE/BANDAGES/DRESSINGS) ×2 IMPLANT
SUT PROLENE 0 CT 1 30 (SUTURE) ×3 IMPLANT
SUT PROLENE 0 CT 2 (SUTURE) ×3 IMPLANT
SUT VIC AB 0 CT1 36 (SUTURE) ×12 IMPLANT
SUT VIC AB 2-0 BRD 54 (SUTURE) ×3 IMPLANT
SUT VIC AB 2-0 CT1 27 (SUTURE) ×2
SUT VIC AB 2-0 CT1 TAPERPNT 27 (SUTURE) ×1 IMPLANT
SUT VIC AB 3-0 SH 27 (SUTURE) ×2
SUT VIC AB 3-0 SH 27X BRD (SUTURE) ×1 IMPLANT
SUT VIC AB 4-0 FS2 27 (SUTURE) ×3 IMPLANT
SUT VICRYL 2 TP 1 (SUTURE) ×3 IMPLANT
SUT VICRYL+ 3-0 144IN (SUTURE) ×3 IMPLANT
SWABSTK COMLB BENZOIN TINCTURE (MISCELLANEOUS) ×3 IMPLANT
SYR CONTROL 10ML (SYRINGE) ×3 IMPLANT

## 2015-03-25 NOTE — Anesthesia Procedure Notes (Signed)
Procedure Name: Intubation Date/Time: 03/25/2015 7:42 AM Performed by: Aline Brochure Pre-anesthesia Checklist: Patient identified, Emergency Drugs available, Suction available and Patient being monitored Patient Re-evaluated:Patient Re-evaluated prior to inductionOxygen Delivery Method: Circle system utilized Preoxygenation: Pre-oxygenation with 100% oxygen Intubation Type: IV induction Ventilation: Oral airway inserted - appropriate to patient size and Mask ventilation without difficulty Laryngoscope Size: Mac and 3 Grade View: Grade I Tube type: Oral Tube size: 7.0 mm Number of attempts: 1 Airway Equipment and Method: Patient positioned with wedge pillow and Stylet Placement Confirmation: ETT inserted through vocal cords under direct vision,  positive ETCO2 and breath sounds checked- equal and bilateral Secured at: 21 cm Tube secured with: Tape Dental Injury: Teeth and Oropharynx as per pre-operative assessment

## 2015-03-25 NOTE — Interval H&P Note (Signed)
History and Physical Interval Note:  03/25/2015 7:09 AM  Breanna Lawson  has presented today for surgery, with the diagnosis of VENTRA LHERNIA  The various methods of treatment have been discussed with the patient and family. After consideration of risks, benefits and other options for treatment, the patient has consented to  Procedure(s): HERNIA REPAIR VENTRAL ADULT (N/A) INSERTION OF MESH (N/A) as a surgical intervention .  The patient's history has been reviewed, patient examined, no change in status, stable for surgery.  I have reviewed the patient's chart and labs.  Questions were answered to the patient's satisfaction.     Kemar Pandit G

## 2015-03-25 NOTE — Anesthesia Postprocedure Evaluation (Signed)
  Anesthesia Post-op Note  Patient: Breanna Lawson  Procedure(s) Performed: Procedure(s): HERNIA REPAIR VENTRAL ADULT (N/A) INSERTION OF MESH (N/A)  Anesthesia type:General  Patient location: PACU  Post pain: Pain level controlled  Post assessment: Post-op Vital signs reviewed, Patient's Cardiovascular Status Stable, Respiratory Function Stable, Patent Airway and No signs of Nausea or vomiting  Post vital signs: Reviewed and stable  Last Vitals:  Filed Vitals:   03/25/15 1218  BP: 115/51  Pulse: 71  Temp:   Resp: 17    Level of consciousness: awake, alert  and patient cooperative  Complications: No apparent anesthesia complications

## 2015-03-25 NOTE — Op Note (Signed)
Preop diagnosis: Ventral hernia  Post op diagnosis: Same  Operation: Repair of large ventral hernia with component separation and mesh insertion  Surgeon: S.G.Nurse, children's: J.W.Byrnett   Anesthesia: Gen.  Complications: None  EBL: 50 mL  Drains: 2 Jackson-Pratt drains  Description: The patient was put to sleep in the supine position the operating table the abdomen was then prepped and draped sterile field this patient had 2 sizable hernias one containing omental tissue on the outer portion of the transverse colon and omentum one was located just above the umbilicus and towards the left and the other just below the umbilicus into the right. The vertical incision was mapped out to extend well beyond the 2 hernias on both both ends. Timeout was performed. Skin incision was made in a vertical orientation going around the umbilicus on the left side and carefully deepened through to expose the hernial protrusions which were then freed and the incision extended down to the fascia and the fascial opening of the 2 spots. The hernial sac was opened and some adhesion of the omentum and the transverse colon area were freed until all of this was freed from the hernia. The hernial sac. The sac was then excised out completely couple smaller little hernias containing omental fat were also encountered in the fascia below the 2 large defects these were also removed. After hernia was satisfactorily freed from the fascia component separation was accomplished. The posterior sheath closed the linea alba was opened and extended up and down the length of the incision the rectus muscle was then lifted off from the posterior sheath to allow placement of the mesh thereafter as was done on both sidesallowed for a proximally at 10-12" x 8" mesh the posterior sheath was then reapproximated with a running suture of 0 Vicryl space was then irrigated out a 10 x 14" mesh was then brought up to the field- this was a Bard  mesh.It was then cut to deep configuration and size of the retrorectus space. 0 proline stitches were placed at the corners and in the implant. Edge laterally on both sides the mass mesh was then placed the retrorectus space using a suture passer the sutures place in the mesh were then brought out through the abdominal wall through tiny stab incisions and tied down with secure placement of the mesh similarly a 0 proline stitch was used to to [place] of the superior and inferior and beyond the edge of the fascial opening after this was done the wound was again irrigated retrorectus space containing the mesh was drained with 2 Jackson-Pratt drains one on the right and one on the left. Drains were fastened to the skin with a nylon stitch the anterior sheath and linea alba with then closed with interrupted 0 Prolenes figure-of-eight stitches. The wound was again irrigated. Subcutaneous tissue approximated with a running 2-0 Vicryl stitch. Skin was approximated with staples. Dry sterile dressing was placed in an abdominal binder was then placed. Patient subsequently was extubated and returned recovery room in stable condition.Marland Kitchen

## 2015-03-25 NOTE — Anesthesia Preprocedure Evaluation (Signed)
Anesthesia Evaluation  Patient identified by MRN, date of birth, ID band Patient awake    Reviewed: Allergy & Precautions, NPO status , Patient's Chart, lab work & pertinent test results  History of Anesthesia Complications (+) PONV  Airway Mallampati: II  TM Distance: >3 FB Neck ROM: Full    Dental  (+) Edentulous Upper, Edentulous Lower   Pulmonary neg pulmonary ROS,  breath sounds clear to auscultation  Pulmonary exam normal       Cardiovascular Exercise Tolerance: Poor hypertension, Pt. on medications Normal cardiovascular examRhythm:Regular Rate:Normal     Neuro/Psych negative neurological ROS  negative psych ROS   GI/Hepatic Neg liver ROS, GERD-  Medicated and Controlled,  Endo/Other  negative endocrine ROS  Renal/GU Renal InsufficiencyRenal disease  negative genitourinary   Musculoskeletal  (+) Arthritis -, Osteoarthritis,    Abdominal   Peds negative pediatric ROS (+)  Hematology negative hematology ROS (+)   Anesthesia Other Findings   Reproductive/Obstetrics negative OB ROS                             Anesthesia Physical Anesthesia Plan  ASA: III  Anesthesia Plan: General   Post-op Pain Management:    Induction: Intravenous  Airway Management Planned: Oral ETT  Additional Equipment:   Intra-op Plan:   Post-operative Plan: Extubation in OR  Informed Consent: I have reviewed the patients History and Physical, chart, labs and discussed the procedure including the risks, benefits and alternatives for the proposed anesthesia with the patient or authorized representative who has indicated his/her understanding and acceptance.     Plan Discussed with: CRNA and Surgeon  Anesthesia Plan Comments:         Anesthesia Quick Evaluation

## 2015-03-25 NOTE — H&P (View-Only) (Signed)
Patient ID: Breanna Lawson, female   DOB: 11-29-1945, 69 y.o.   MRN: [id number]  Chief Complaint  Patient presents with  . Follow-up    ventral hernia    HPI Breanna Lawson is a 69 y.o. female here today for her three month follow up ventral hernia. Patient states she has occasional pains maybe once or twice a week.  She is wearing her back brace. She has lost 19 pounds according to her scales since last visit.  HPI  Past Medical History  Diagnosis Date  . Hypertension   . GERD (gastroesophageal reflux disease)   . Arthritis     Past Surgical History  Procedure Laterality Date  . Colonoscopy      more than 10 years ago.   . Ankle surgery Left   . Cholecystectomy      Family History  Problem Relation Age of Onset  . Heart failure Father   . Heart attack Mother   . Breast cancer Sister     Social History History  Substance Use Topics  . Smoking status: Never Smoker   . Smokeless tobacco: Never Used  . Alcohol Use: No    No Known Allergies  Current Outpatient Prescriptions  Medication Sig Dispense Refill  . aspirin 81 MG tablet Take 81 mg by mouth daily.    . calcium-vitamin D 250-100 MG-UNIT per tablet Take 1 tablet by mouth 2 (two) times daily.    Marland Kitchen lisinopril-hydrochlorothiazide (PRINZIDE,ZESTORETIC) 20-25 MG per tablet Take 1 tablet by mouth daily.    . Loperamide HCl (ANTI-DIARRHEAL PO) Take by mouth as needed.    . meloxicam (MOBIC) 15 MG tablet     . ranitidine (ZANTAC) 300 MG tablet Take 300 mg by mouth at bedtime.     No current facility-administered medications for this visit.    Review of Systems Review of Systems  Constitutional: Negative.   Respiratory: Negative.   Cardiovascular: Negative.   Gastrointestinal: Negative for diarrhea and constipation.    Blood pressure 142/80, pulse 68, resp. rate 18, height 5' 3"  (1.6 m), weight 258 lb (117.028 kg).  Physical Exam Physical Exam  Constitutional: She is oriented to person, place, and time.  She appears well-developed and well-nourished.  Eyes: Conjunctivae are normal. No scleral icterus.  Neck: Neck supple.  Cardiovascular: Normal rate, regular rhythm and normal heart sounds.   Pulmonary/Chest: Effort normal and breath sounds normal.  Abdominal: Soft. Normal appearance. There is no tenderness. A hernia is present. Hernia confirmed positive in the ventral area.  2 Hernias present ventral 5 x 6 cm each  Lymphadenopathy:    She has no cervical adenopathy.  Neurological: She is alert and oriented to person, place, and time.  Skin: Skin is warm and dry.    Data Reviewed Office notes.  Assessment    Ventral hernia Non reducible. Pt has lost weight as recommended. Will check labs and if normal will schedule repir of the hernias with component separation. This was explained in full to pt.    Plan    Lab work today,  CBC Met C   Hernia precautions and incarceration were discussed with the patient. If they develop symptoms of an incarcerated hernia, they were encouraged to seek prompt medical attention.  I have recommended repair of the hernia using mesh on an outpatient basis in the near future. The risk of infection was reviewed. The role of prosthetic mesh to minimize the risk of recurrence was reviewed.      Patient's  surgery to be arranged after lab work reviewed.  PCP:  Gretta Arab 02/24/2015, 11:10 AM

## 2015-03-25 NOTE — Transfer of Care (Signed)
Immediate Anesthesia Transfer of Care Note  Patient: Breanna Lawson  Procedure(s) Performed: Procedure(s): HERNIA REPAIR VENTRAL ADULT (N/A) INSERTION OF MESH (N/A)  Patient Location: PACU  Anesthesia Type:General  Level of Consciousness: awake, alert  and oriented  Airway & Oxygen Therapy: Patient Spontanous Breathing and Patient connected to face mask oxygen  Post-op Assessment: Report given to RN and Post -op Vital signs reviewed and stable  Post vital signs: stable  Last Vitals:  Filed Vitals:   03/25/15 1039  BP: 111/55  Pulse: 83  Temp: 36.9 C  Resp: 19    Complications: No apparent anesthesia complications

## 2015-03-26 LAB — CBC
HEMATOCRIT: 31.7 % — AB (ref 35.0–47.0)
Hemoglobin: 10.2 g/dL — ABNORMAL LOW (ref 12.0–16.0)
MCH: 28 pg (ref 26.0–34.0)
MCHC: 32.2 g/dL (ref 32.0–36.0)
MCV: 87 fL (ref 80.0–100.0)
PLATELETS: 252 10*3/uL (ref 150–440)
RBC: 3.65 MIL/uL — AB (ref 3.80–5.20)
RDW: 14.7 % — AB (ref 11.5–14.5)
WBC: 14.6 10*3/uL — ABNORMAL HIGH (ref 3.6–11.0)

## 2015-03-26 LAB — BASIC METABOLIC PANEL
Anion gap: 6 (ref 5–15)
BUN: 27 mg/dL — ABNORMAL HIGH (ref 6–20)
CALCIUM: 8.9 mg/dL (ref 8.9–10.3)
CO2: 24 mmol/L (ref 22–32)
Chloride: 104 mmol/L (ref 101–111)
Creatinine, Ser: 1.45 mg/dL — ABNORMAL HIGH (ref 0.44–1.00)
GFR calc Af Amer: 42 mL/min — ABNORMAL LOW (ref >60)
GFR, EST NON AFRICAN AMERICAN: 37 mL/min — AB (ref >60)
GLUCOSE: 118 mg/dL — AB (ref 65–99)
Potassium: 4.5 mmol/L (ref 3.5–5.1)
Sodium: 134 mmol/L — ABNORMAL LOW (ref 135–145)

## 2015-03-26 MED ORDER — ENOXAPARIN SODIUM 40 MG/0.4ML ~~LOC~~ SOLN
40.0000 mg | SUBCUTANEOUS | Status: DC
  Administered 2015-03-26 – 2015-03-28 (×3): 40 mg via SUBCUTANEOUS
  Filled 2015-03-26 (×3): qty 0.4

## 2015-03-26 NOTE — Progress Notes (Signed)
Patient ID: Breanna Lawson, female   DOB: [birth year], 69 y.o.   MRN: [id number] Overall patient appears to be doing well. Vital signs are stable.. Patient is tolerating regular food without any difficulty. Abdominal incision appears to be intact and clean. Abdomen is soft with good bowel sounds. Lungs are clear. Labs are okay. Drainage is decreased some and becoming more serous sanguinous. Encourage out of bed and ambulation today. IV can be DC'd if by mouth intake is adequate.

## 2015-03-26 NOTE — Progress Notes (Signed)
Anticoag Monitoring: Patient currently ordered Lovenox 30mg  SQ daily for DVT prevention post-op. Estimated CrCl of 48.6 ml/min. Per protocol, will adjust dose to Lovenox 40mg  SQ daily for CrCl above 107ml/min.  Paulina Fusi, PharmD, BCPS 03/26/2015 2:30 PM

## 2015-03-27 MED ORDER — PANTOPRAZOLE SODIUM 40 MG PO TBEC
40.0000 mg | DELAYED_RELEASE_TABLET | Freq: Every day | ORAL | Status: DC
  Administered 2015-03-27 – 2015-03-29 (×3): 40 mg via ORAL
  Filled 2015-03-27 (×3): qty 1

## 2015-03-27 NOTE — Progress Notes (Signed)
Patient ID: Breanna Lawson, female   DOB: [birth year], 69 y.o.   MRN: [id number] Patient is tolerating a diet well. Has  not had a bowel movement yet. Abdominal incision looks clean and intact. The drainage is mostly serous at this time  reducing in quantity slowly. Afebrile and vital signs are stable. Patient lives alone at home has limited help and she feels uncomfortable going home. Will get the care management to assess discharge.

## 2015-03-27 NOTE — Clinical Documentation Improvement (Signed)
Lab has reported creatinine values of 1.62 and 1.45 for this patient this admission.  What, if any, clinical significance can be associated with these values?  Thank you, Carrolyn Meiers, RN [email or web address] [phone]

## 2015-03-28 ENCOUNTER — Encounter: Payer: Self-pay | Admitting: General Surgery

## 2015-03-28 NOTE — Care Management Note (Signed)
Case Management Note  Patient Details  Name: Breanna Lawson MRN: [id number] Date of Birth: Jul 03, 1946  Subjective/Objective:    69yo Breanna Breanna Lawson was admitted 03/25/15 from home for repair of a ventral hernia and received surgery that same day. Breanna Lawson resides alone but has supportive friends and family. PCP=[facility] Pharmacy= [facility] on [address]  Denies having any home equipment. Received an evaluation by [facility]-PT today who recommended Rehab. Breanna Lawson is agreeable to going to Rehab and Clinical Social Work is arranging rehab. placement. Breanna Lawson requested a raised toilet seat but because insurance does not cover this item, Breanna Lawson was directed to purchase this item at [place] across the street from [facility], or from any other Medical Supply store. No other case management needs anticipated.                    Expected Discharge Date:                  Expected Discharge Plan:     In-House Referral:     Discharge planning Services     Post Acute Care Choice:    Choice offered to:     DME Arranged:    DME Agency:     HH Arranged:    [place] Agency:     Status of Service:     Medicare Important Message Given:  Yes-second notification given Date Medicare IM Given:    Medicare IM give by:    Date Additional Medicare IM Given:    Additional Medicare Important Message give by:     If discussed at [place] of Stay Meetings, dates discussed:    Additional Comments:  Edrees Valent A, RN 03/28/2015, 2:06 PM

## 2015-03-28 NOTE — Progress Notes (Signed)
Patient ID: Breanna Lawson, female   DOB: 1946/04/10, 69 y.o.   MRN: [id number] Overall pt is doing well. Only concern-she lives alone and does not feel safe to go home. Drain on right side was removed-drained 21ml last 24 hrs. Incision is clean and intact. Arrangement being made for her to go STR.  Will plan for discharge tomorrow am.

## 2015-03-28 NOTE — Clinical Social Work Note (Signed)
CSW met with pt to provide bed offers. Pt chose [facility]. CSW updated facility. CSW also spoke with [facility]. Josem Kaufmann has been obtained for discharge tomorrow Josem Kaufmann # [phone]). CSW updated MD, who discharge tomorrow in the AM. CSW will continue to follow.   Darden Dates, MSW, LCSW Clinical Social Worker [phone]

## 2015-03-28 NOTE — Clinical Social Work Note (Signed)
Clinical Social Work Assessment  Patient Details  Name: Breanna Lawson MRN: [id number] Date of Birth: 1946/04/01  Date of referral:  03/27/15               Reason for consult:  Discharge Planning                Permission sought to share information with:  Facility Art therapist granted to share information::  Yes, Verbal Permission Granted  Name::        Agency::     Relationship::     Contact Information:     Housing/Transportation Living arrangements for the past 2 months:  Apartment Source of Information:  Patient Patient Interpreter Needed:  None Criminal Activity/Legal Involvement Pertinent to Current Situation/Hospitalization:    Significant Relationships:  Adult Children, Friend Lives with:  Self Do you feel safe going back to the place where you live?  No (Pt lives alone and stated she doesn't feel that she ambulate at home on her own.) Need for family participation in patient care:  No (Coment)  Care giving concerns:  Pt lives alone.   Social Worker assessment / plan:  Pt is a 69 y/o widowed female who currently lives alone in a senior community.  Pt has adult children that are supportive.  Pt is concerned that she will not be able to function at home since she lives alone.  Pt is interested in going to STR.  A PT evaluation is currently pending.  CSW explained to pt that PT would evaluate her and make recommendations.  Pt stated she has never been to STR and she does not currently have any assistive devices at home.  If STR/SNF is recommended pt prefers [facility].  CSW will continue follow and assist with d/c planning needs.  Employment status:  Retired Forensic scientist:  Medicare PT Recommendations:  Not assessed at this time Information / Referral to community resources:     Patient/Family's Response to care: Pt was pleasant and appreciative of CSW assistance.  Patient/Family's Understanding of and Emotional Response to Diagnosis,  Current Treatment, and Prognosis:  Pt is apprehensive about being discharged home without support.  CSW assured pt that PT would assess her and make recommendations specific to the level of support she needs.  Emotional Assessment Appearance:  Appears stated age Attitude/Demeanor/Rapport:  Other (Pleasant) Affect (typically observed):  Accepting, Apprehensive Orientation:  Oriented to Self, Oriented to Place, Oriented to Situation, Oriented to  Time Alcohol / Substance use:  Not Applicable Psych involvement (Current and /or in the community):  No (Comment)  Discharge Needs  Concerns to be addressed:  Discharge Planning Concerns Readmission within the last 30 days:  No Current discharge risk:  Lives alone Barriers to Discharge:  No Barriers Identified  [facility], [place], LCSW 03/28/2015, 11:55 AM

## 2015-03-28 NOTE — Clinical Social Work Placement (Signed)
   CLINICAL SOCIAL WORK PLACEMENT  NOTE  Date:  03/28/2015  Patient Details  Name: Breanna Lawson MRN: [id number] Date of Birth: 1946/05/31  Clinical Social Work is seeking post-discharge placement for this patient at the [place] level of care (*CSW will initial, date and re-position this form in  chart as items are completed):  Yes   Patient/family provided with [place] Work Department's list of facilities offering this level of care within the geographic area requested by the patient (or if unable, by the patient's family).  Yes   Patient/family informed of their freedom to choose among providers that offer the needed level of care, that participate in Medicare, Medicaid or managed care program needed by the patient, have an available bed and are willing to accept the patient.  Yes   Patient/family informed of [facility] ownership interest in [facility] and [facility], as well as of the fact that they are under no obligation to receive care at these facilities.  PASRR submitted to EDS on 03/28/15     PASRR number received on 03/28/15     Existing PASRR number confirmed on       FL2 transmitted to all facilities in geographic area requested by pt/family on 03/28/15     FL2 transmitted to all facilities within larger geographic area on       Patient informed that his/her managed care company has contracts with or will negotiate with certain facilities, including the following:        Yes   Patient/family informed of bed offers received.  Patient chooses bed at  [facility])     Physician recommends and patient chooses bed at  [facility])    Patient to be transferred to   on  .  Patient to be transferred to facility by       Patient family notified on   of transfer.  Name of family member notified:        PHYSICIAN       Additional Comment:    _______________________________________________ Darden Dates,  LCSW 03/28/2015, 4:23 PM

## 2015-03-28 NOTE — Evaluation (Signed)
Physical Therapy Evaluation Patient Details Name: Breanna Lawson MRN: [id number] DOB: [birth year] Today's Date: 03/28/2015   History of Present Illness  Pt is admitted for repair of ventral hernia surgery on 03/25/15 by Dr. Jamal Collin.   Clinical Impression  Pt is a pleasant 69 year old female who was admitted for surgical repair of ventral hernia. Pt performs bed mobility with mod assist, transfers with min assist, and ambulation with rw and cga. Pt demonstrates deficits with pain, strength, functional mobility. Would benefit from skilled PT to address above deficits and promote optimal return to PLOF. Recommend transition to STR upon discharge from acute hospitalization.       Follow Up Recommendations SNF    Equipment Recommendations  Rolling walker with 5" wheels    Recommendations for Other Services       Precautions / Restrictions Precautions Required Braces or Orthoses: Other Brace/Splint (lumbar corset, however too small, unable to use) Restrictions Weight Bearing Restrictions: No      Mobility  Bed Mobility Overal bed mobility: Needs Assistance Bed Mobility: Supine to Sit;Sit to Supine           General bed mobility comments: bed mobility performed with mod assist from flat surface to simulate home environment. Pt performed log rolling with B knees bent, however still required heavy assist for completion of rolling and transfer. Once seated at EOB, pt able to sit with supervision.  Transfers Overall transfer level: Needs assistance Equipment used: Rolling walker (2 wheeled) Transfers: Sit to/from Stand           General transfer comment: sit<>Stand with rw and min assist from lowered surface to simulate home environment. Pt requires cues for correct hand placement on bed prior to transfer.  Ambulation/Gait Ambulation/Gait assistance: Min guard Ambulation Distance (Feet): 20 Feet Assistive device: Rolling walker (2 wheeled)       General Gait Details:  ambulated using cga demonstrating safe technique. Reciprocal gait pattern performed, however she fatigues quickly and describes increased pain with movement. Pain decreases at rest.  Stairs            Wheelchair Mobility    Modified Rankin (Stroke Patients Only)       Balance Overall balance assessment: Modified Independent                                           Pertinent Vitals/Pain Pain Assessment: 0-10 Pain Score: 8  Pain Location: abdomen Pain Descriptors / Indicators: Operative site guarding Pain Intervention(s): Limited activity within patient's tolerance    Home Living Family/patient expects to be discharged to:: Private residence Living Arrangements: Alone Available Help at Discharge:  (none) Type of Home: House Home Access: Level entry     Home Layout: One level Home Equipment: None      Prior Function Level of Independence: Independent               Hand Dominance        Extremity/Trunk Assessment   Upper Extremity Assessment: Generalized weakness           Lower Extremity Assessment: Overall WFL for tasks assessed         Communication   Communication: No difficulties  Cognition Arousal/Alertness: Awake/alert Behavior During Therapy: WFL for tasks assessed/performed Overall Cognitive Status: Within Functional Limits for tasks assessed  General Comments      Exercises        Assessment/Plan    PT Assessment Patient needs continued PT services  PT Diagnosis Acute pain;Generalized weakness   PT Problem List Decreased strength;Decreased activity tolerance;Pain;Decreased mobility  PT Treatment Interventions Gait training;Therapeutic exercise;Functional mobility training;Therapeutic activities   PT Goals (Current goals can be found in the Care Plan section) Acute Rehab PT Goals Patient Stated Goal: to go to rehab PT Goal Formulation: With patient Time For Goal Achievement:  04/11/15 Potential to Achieve Goals: Good    Frequency Min 2X/week   Barriers to discharge Decreased caregiver support      Co-evaluation               End of Session Equipment Utilized During Treatment: Gait belt Activity Tolerance: Patient limited by pain Patient left: in bed;with bed alarm set Nurse Communication: Mobility status         Time: [id number] PT Time Calculation (min) (ACUTE ONLY): 23 min   Charges:   PT Evaluation $Initial PT Evaluation Tier I: 1 Procedure     PT G Codes:        Breanna Lawson 04-12-15, 3:02 PM Breanna Lawson, PT, DPT [phone]

## 2015-03-28 NOTE — Care Management Note (Signed)
Case Management Note  Patient Details  Name: Breanna Lawson MRN: [id number] Date of Birth: [birth year]  Subjective/Objective:                    Action/Plan:   Expected Discharge Date:                  Expected Discharge Plan:     In-House Referral:     Discharge planning Services     Post Acute Care Choice:    Choice offered to:     DME Arranged:    DME Agency:     HH Arranged:    [place] Agency:     Status of Service:     Medicare Important Message Given:   Yes Date Medicare IM Given:   03/28/15 Medicare IM give by:   L.Amun Stemm, RN Date Additional Medicare IM Given:    Additional Medicare Important Message give by:     If discussed at [place] of Stay Meetings, dates discussed:    Additional Comments:  Alaric Gladwin A, RN 03/28/2015, 10:01 AM

## 2015-03-29 DIAGNOSIS — M6281 Muscle weakness (generalized): Secondary | ICD-10-CM | POA: Diagnosis not present

## 2015-03-29 DIAGNOSIS — E669 Obesity, unspecified: Secondary | ICD-10-CM | POA: Diagnosis not present

## 2015-03-29 DIAGNOSIS — K219 Gastro-esophageal reflux disease without esophagitis: Secondary | ICD-10-CM | POA: Diagnosis not present

## 2015-03-29 DIAGNOSIS — K439 Ventral hernia without obstruction or gangrene: Secondary | ICD-10-CM | POA: Diagnosis not present

## 2015-03-29 DIAGNOSIS — I10 Essential (primary) hypertension: Secondary | ICD-10-CM | POA: Diagnosis not present

## 2015-03-29 DIAGNOSIS — I129 Hypertensive chronic kidney disease with stage 1 through stage 4 chronic kidney disease, or unspecified chronic kidney disease: Secondary | ICD-10-CM | POA: Diagnosis not present

## 2015-03-29 DIAGNOSIS — I82409 Acute embolism and thrombosis of unspecified deep veins of unspecified lower extremity: Secondary | ICD-10-CM | POA: Diagnosis not present

## 2015-03-29 DIAGNOSIS — M1990 Unspecified osteoarthritis, unspecified site: Secondary | ICD-10-CM | POA: Diagnosis not present

## 2015-03-29 DIAGNOSIS — Z48815 Encounter for surgical aftercare following surgery on the digestive system: Secondary | ICD-10-CM | POA: Diagnosis not present

## 2015-03-29 LAB — SURGICAL PATHOLOGY

## 2015-03-29 MED ORDER — OXYCODONE HCL 5 MG PO TABS: 5.0000 mg | ORAL_TABLET | ORAL | Status: DC | PRN

## 2015-03-29 NOTE — Progress Notes (Signed)
   03/29/15 1300  Clinical Encounter Type  Visited With Patient  Visit Type Spiritual support  Referral From Other (Comment)  Stress Factors  Patient Stress Factors Health changes  Family Stress Factors None identified  Advance Directives (For Healthcare)  Would patient like information on creating an advanced directive? No - patient declined information  Chaplain Marcello Moores engaged patient. Patient stated she was feeling better and hoped to go home soon. Patient talked with chaplain about physical therapy and prayer. Chaplain Zigmund Linse

## 2015-03-29 NOTE — Clinical Social Work Placement (Signed)
   CLINICAL SOCIAL WORK PLACEMENT  NOTE  Date:  03/29/2015  Patient Details  Name: DANIALLE ALBERS MRN: [id number] Date of Birth: [birth year]  Clinical Social Work is seeking post-discharge placement for this patient at the [place] level of care (*CSW will initial, date and re-position this form in  chart as items are completed):  Yes   Patient/family provided with [place] Work Department's list of facilities offering this level of care within the geographic area requested by the patient (or if unable, by the patient's family).  Yes   Patient/family informed of their freedom to choose among providers that offer the needed level of care, that participate in Medicare, Medicaid or managed care program needed by the patient, have an available bed and are willing to accept the patient.  Yes   Patient/family informed of [facility]'s ownership interest in [facility] and [facility], as well as of the fact that they are under no obligation to receive care at these facilities.  PASRR submitted to EDS on 03/28/15     PASRR number received on 03/28/15     Existing PASRR number confirmed on       FL2 transmitted to all facilities in geographic area requested by pt/family on 03/28/15     FL2 transmitted to all facilities within larger geographic area on       Patient informed that his/her managed care company has contracts with or will negotiate with certain facilities, including the following:        Yes   Patient/family informed of bed offers received.  Patient chooses bed at  [facility])     Physician recommends and patient chooses bed at  [facility])    Patient to be transferred to  [facility]) on 03/29/15.  Patient to be transferred to facility by  (EMS)     Patient family notified on 03/29/15 of transfer.  Name of family member notified:  n/a     PHYSICIAN       Additional Comment:     _______________________________________________ Naida Sleight, LCSW 03/29/2015, 10:26 AM

## 2015-03-29 NOTE — Discharge Summary (Addendum)
Patient name: Breanna Lawson  Date of admission: 03/25/2015  Date of discharge: 03/29/2015  Diagnosis: Ventral hernia.  Additional diagnoses: Hypertension. Obesity. Chronic renal insufficiency. GERD   Hospital course. This 68 year old female presented a few months ago with the complaint of from the symptomatic hernias in her abdominal wall. The patient was noted be moderately obese and this was felt to be a significant risk factor for repair. The patient lost approximately 19-20 pounds over 3 months.. At this point the reevaluation was performed and decision made to proceed with surgery. CT scan had shown 2 large defects one above and to the left of the umbilicus and one below just below and right of the umbilicus one containing a large portion of omentum and the other portion of colon bowel. Preoperative evaluation also noted the patient had mild elevation of creatinine. This was evaluated with evaluated by nephrology and felt to be stable and did not require any manipulation preoperatively. On 03/25/2015 the patient underwent repair of this large ventral hernia with component separation and insertion of a wide mesh in the retrorectus space. Postoperative course was basically uncomplicated. Both drains were placed overlying the retrorectus space have been removed at the time of discharge. Patient is tolerating oral intake and her bowels are working. Lab values have been stable. Incision is clean and healing well. The patient had considerable difficulty in sinus, no murmurs all out of bed and ambulate safely. She lives alone and accordingly physical therapy evaluation was obtained. Short-term rehabilitation was recommended patient is being discharged to a facility for the same. Patient is to return to see me next week on 04/06/2015.  Discharge medications:    Medication List    TAKE these medications        aspirin 81 MG tablet  Take 81 mg by mouth daily.     calcium-vitamin D 250-100  MG-UNIT per tablet  Take 1 tablet by mouth daily.     lisinopril-hydrochlorothiazide 20-25 MG per tablet  Commonly known as:  PRINZIDE,ZESTORETIC  Take 1 tablet by mouth daily.     oxyCODONE 5 MG immediate release tablet  Commonly known as:  Oxy IR/ROXICODONE  Take 1 tablet (5 mg total) by mouth every 4 (four) hours as needed for moderate pain.     ranitidine 300 MG tablet  Commonly known as:  ZANTAC  Take 300 mg by mouth daily.

## 2015-03-29 NOTE — Clinical Social Work Note (Signed)
Pt is ready for d/c.  CSW informed pt and staff at [facility].  Pt will be transported via EMS.  CSW sent d/c documentation to facility.  CSW provided room and report information to RN.  CSW signing off as there are no further needs at this time.  [place], [place]

## 2015-03-30 DIAGNOSIS — I10 Essential (primary) hypertension: Secondary | ICD-10-CM | POA: Diagnosis not present

## 2015-03-30 DIAGNOSIS — K219 Gastro-esophageal reflux disease without esophagitis: Secondary | ICD-10-CM | POA: Diagnosis not present

## 2015-03-30 DIAGNOSIS — Z48815 Encounter for surgical aftercare following surgery on the digestive system: Secondary | ICD-10-CM | POA: Diagnosis not present

## 2015-03-31 DIAGNOSIS — I82409 Acute embolism and thrombosis of unspecified deep veins of unspecified lower extremity: Secondary | ICD-10-CM | POA: Diagnosis not present

## 2015-03-31 DIAGNOSIS — K439 Ventral hernia without obstruction or gangrene: Secondary | ICD-10-CM | POA: Diagnosis not present

## 2015-03-31 DIAGNOSIS — K219 Gastro-esophageal reflux disease without esophagitis: Secondary | ICD-10-CM | POA: Diagnosis not present

## 2015-03-31 DIAGNOSIS — I10 Essential (primary) hypertension: Secondary | ICD-10-CM | POA: Diagnosis not present

## 2015-04-06 ENCOUNTER — Ambulatory Visit (INDEPENDENT_AMBULATORY_CARE_PROVIDER_SITE_OTHER): Payer: Commercial Managed Care - HMO | Admitting: General Surgery

## 2015-04-06 ENCOUNTER — Encounter: Payer: Self-pay | Admitting: General Surgery

## 2015-04-06 VITALS — BP 140/78 | HR 72 | Resp 16 | Ht 63.0 in | Wt 256.0 lb

## 2015-04-06 DIAGNOSIS — K439 Ventral hernia without obstruction or gangrene: Secondary | ICD-10-CM

## 2015-04-06 NOTE — Patient Instructions (Addendum)
Patient to return in four weeks. No exertional activity. May shower.

## 2015-04-06 NOTE — Progress Notes (Signed)
Patient ID: Breanna Lawson, female   DOB: 12-16-1945, 69 y.o.   MRN: [id number]  Chief Complaint  Patient presents with  . Routine Post Op    ventral hernia repair    HPI Breanna Lawson is a 69 y.o. female here today for her post op ventral hernia repair with component separation done on 03/25/15. Pt had limitations being able to function independantly after surgery and is now in short term rehab. Patient states she is doing well. She feels ready to go home. HPI  Past Medical History  Diagnosis Date  . Hypertension   . GERD (gastroesophageal reflux disease)   . Arthritis   . Chronic kidney disease     Past Surgical History  Procedure Laterality Date  . Colonoscopy      more than 10 years ago.   . Ankle surgery Left   . Cholecystectomy    . Dilation and curettage of uterus    . Ventral hernia repair N/A 03/25/2015    Procedure: HERNIA REPAIR VENTRAL ADULT;  Surgeon: Christene Lye, MD;  Location: [facility] ORS;  Service: General;  Laterality: N/A;  . Insertion of mesh N/A 03/25/2015    Procedure: INSERTION OF MESH;  Surgeon: Christene Lye, MD;  Location: [facility] ORS;  Service: General;  Laterality: N/A;    Family History  Problem Relation Age of Onset  . Heart failure Father   . Heart attack Mother   . Breast cancer Sister     Social History History  Substance Use Topics  . Smoking status: Never Smoker   . Smokeless tobacco: Never Used  . Alcohol Use: No    No Known Allergies  Current Outpatient Prescriptions  Medication Sig Dispense Refill  . aspirin 81 MG tablet Take 81 mg by mouth daily.    . calcium-vitamin D 250-100 MG-UNIT per tablet Take 1 tablet by mouth daily.     Marland Kitchen lisinopril-hydrochlorothiazide (PRINZIDE,ZESTORETIC) 20-25 MG per tablet Take 1 tablet by mouth daily.    Marland Kitchen oxyCODONE (OXY IR/ROXICODONE) 5 MG immediate release tablet Take 1 tablet (5 mg total) by mouth every 4 (four) hours as needed for moderate pain. 30 tablet 0  . ranitidine (ZANTAC)  300 MG tablet Take 300 mg by mouth daily.      No current facility-administered medications for this visit.    Review of Systems Review of Systems  Constitutional: Negative.   Respiratory: Negative.   Cardiovascular: Negative.     Blood pressure 140/78, pulse 72, resp. rate 16, height 5\' 3"  (1.6 m), weight 256 lb (116.121 kg).  Physical Exam Physical Exam  Constitutional: She is oriented to person, place, and time. She appears well-developed and well-nourished.  Abdominal: Soft. Normal appearance and bowel sounds are normal.  Neurological: She is alert and oriented to person, place, and time.  Skin: Skin is warm and dry.  The staples were removed and steri strips applied. Incision is well healed and intact  Data Reviewed None  Assessment    Large ventral hernia, post repair. Doing well.    Plan    Patient to return in 4 weeks.      PCP:  Ricardo Jericho 04/06/2015, 6:09 PM

## 2015-04-08 DIAGNOSIS — I10 Essential (primary) hypertension: Secondary | ICD-10-CM | POA: Diagnosis not present

## 2015-04-08 DIAGNOSIS — Z48815 Encounter for surgical aftercare following surgery on the digestive system: Secondary | ICD-10-CM | POA: Diagnosis not present

## 2015-04-08 DIAGNOSIS — K219 Gastro-esophageal reflux disease without esophagitis: Secondary | ICD-10-CM | POA: Diagnosis not present

## 2015-04-19 DIAGNOSIS — M5431 Sciatica, right side: Secondary | ICD-10-CM | POA: Diagnosis not present

## 2015-04-19 DIAGNOSIS — K439 Ventral hernia without obstruction or gangrene: Secondary | ICD-10-CM | POA: Diagnosis not present

## 2015-04-19 DIAGNOSIS — I10 Essential (primary) hypertension: Secondary | ICD-10-CM | POA: Diagnosis not present

## 2015-04-19 DIAGNOSIS — E780 Pure hypercholesterolemia: Secondary | ICD-10-CM | POA: Diagnosis not present

## 2015-05-05 ENCOUNTER — Ambulatory Visit (INDEPENDENT_AMBULATORY_CARE_PROVIDER_SITE_OTHER): Payer: Commercial Managed Care - HMO | Admitting: General Surgery

## 2015-05-05 ENCOUNTER — Encounter: Payer: Self-pay | Admitting: General Surgery

## 2015-05-05 VITALS — BP 132/74 | HR 88 | Resp 18 | Ht 63.0 in | Wt 257.0 lb

## 2015-05-05 DIAGNOSIS — K439 Ventral hernia without obstruction or gangrene: Secondary | ICD-10-CM

## 2015-05-05 NOTE — Progress Notes (Signed)
Patient ID: Breanna Lawson, female   DOB: 05-12-1946, 69 y.o.   MRN: [id number]  Chief Complaint  Patient presents with  . Routine Post Op    HPI Breanna Lawson is a 69 y.o. female. here today for her post op ventral hernia repair with component separation done on 03/25/15. Pt had limitations being able to function independantly after surgery and she went to short term rehab but is home now. Patient states she is doing well.   HPI  Past Medical History  Diagnosis Date  . Hypertension   . GERD (gastroesophageal reflux disease)   . Arthritis   . Chronic kidney disease     Past Surgical History  Procedure Laterality Date  . Colonoscopy      more than 10 years ago.   . Ankle surgery Left   . Cholecystectomy    . Dilation and curettage of uterus    . Ventral hernia repair N/A 03/25/2015    Procedure: HERNIA REPAIR VENTRAL ADULT;  Surgeon: Christene Lye, MD;  Location: [facility] ORS;  Service: General;  Laterality: N/A;  . Insertion of mesh N/A 03/25/2015    Procedure: INSERTION OF MESH;  Surgeon: Christene Lye, MD;  Location: [facility] ORS;  Service: General;  Laterality: N/A;    Family History  Problem Relation Age of Onset  . Heart failure Father   . Heart attack Mother   . Breast cancer Sister     Social History History  Substance Use Topics  . Smoking status: Never Smoker   . Smokeless tobacco: Never Used  . Alcohol Use: No    No Known Allergies  Current Outpatient Prescriptions  Medication Sig Dispense Refill  . aspirin 81 MG tablet Take 81 mg by mouth daily.    . calcium-vitamin D 250-100 MG-UNIT per tablet Take 1 tablet by mouth daily.     Marland Kitchen lisinopril-hydrochlorothiazide (PRINZIDE,ZESTORETIC) 20-25 MG per tablet Take 1 tablet by mouth daily.    . ranitidine (ZANTAC) 300 MG tablet Take 300 mg by mouth daily.      No current facility-administered medications for this visit.    Review of Systems Review of Systems  Constitutional: Negative.    Respiratory: Negative.   Cardiovascular: Negative.     Blood pressure 132/74, pulse 88, resp. rate 18, height 5\' 3"  (1.6 m), weight 257 lb (116.574 kg).  Physical Exam Physical Exam  Constitutional: She is oriented to person, place, and time. She appears well-developed and well-nourished.  Abdominal: Soft. Normal appearance. There is no tenderness.  Hernia repair intact.  Neurological: She is alert and oriented to person, place, and time.  Skin: Skin is warm and dry.  Psychiatric: Her behavior is normal.    Data Reviewed Progress notes.  Assessment    Stable physical exam, repair fully intact. Abdomen soft. Incision well healed.    Plan    Follow up in 6 weeks. The patient is aware to call back for any questions or concerns.      PCP:  Jesus Genera M 05/05/2015, 1:58 PM

## 2015-05-05 NOTE — Patient Instructions (Addendum)
Follow up in 6 weeks. The patient is aware to call back for any questions or concerns.

## 2015-06-14 ENCOUNTER — Ambulatory Visit (INDEPENDENT_AMBULATORY_CARE_PROVIDER_SITE_OTHER): Payer: Commercial Managed Care - HMO | Admitting: General Surgery

## 2015-06-14 ENCOUNTER — Encounter: Payer: Self-pay | Admitting: General Surgery

## 2015-06-14 VITALS — BP 116/64 | HR 78 | Resp 18 | Ht 64.0 in | Wt 259.6 lb

## 2015-06-14 DIAGNOSIS — K439 Ventral hernia without obstruction or gangrene: Secondary | ICD-10-CM

## 2015-06-14 NOTE — Patient Instructions (Addendum)
Follow up as needed

## 2015-06-14 NOTE — Progress Notes (Signed)
69 year old female here today for her post op ventral hernia repair with component separation done on 03/25/15.  She has a pulling sensation at times in the middle of the abdomen. She has no complaints today. Midline incision is well healed. No hernia detected. Abdomen is soft non tender.  Patient with good results post repair of a large ventral hernia. Follow up PRN.

## 2015-06-24 DIAGNOSIS — I10 Essential (primary) hypertension: Secondary | ICD-10-CM | POA: Diagnosis not present

## 2015-06-24 DIAGNOSIS — N183 Chronic kidney disease, stage 3 (moderate): Secondary | ICD-10-CM | POA: Diagnosis not present

## 2015-06-24 DIAGNOSIS — N2581 Secondary hyperparathyroidism of renal origin: Secondary | ICD-10-CM | POA: Diagnosis not present

## 2015-07-01 DIAGNOSIS — M5431 Sciatica, right side: Secondary | ICD-10-CM | POA: Diagnosis not present

## 2015-07-04 DIAGNOSIS — Z23 Encounter for immunization: Secondary | ICD-10-CM | POA: Diagnosis not present

## 2015-07-31 DIAGNOSIS — J45901 Unspecified asthma with (acute) exacerbation: Secondary | ICD-10-CM | POA: Diagnosis not present

## 2015-12-23 DIAGNOSIS — N183 Chronic kidney disease, stage 3 (moderate): Secondary | ICD-10-CM | POA: Diagnosis not present

## 2015-12-23 DIAGNOSIS — I129 Hypertensive chronic kidney disease with stage 1 through stage 4 chronic kidney disease, or unspecified chronic kidney disease: Secondary | ICD-10-CM | POA: Diagnosis not present

## 2015-12-23 DIAGNOSIS — N2581 Secondary hyperparathyroidism of renal origin: Secondary | ICD-10-CM | POA: Diagnosis not present

## 2016-05-18 DIAGNOSIS — H35379 Puckering of macula, unspecified eye: Secondary | ICD-10-CM | POA: Diagnosis not present

## 2016-06-26 DIAGNOSIS — N183 Chronic kidney disease, stage 3 (moderate): Secondary | ICD-10-CM | POA: Diagnosis not present

## 2016-06-26 DIAGNOSIS — R601 Generalized edema: Secondary | ICD-10-CM | POA: Diagnosis not present

## 2016-06-26 DIAGNOSIS — I129 Hypertensive chronic kidney disease with stage 1 through stage 4 chronic kidney disease, or unspecified chronic kidney disease: Secondary | ICD-10-CM | POA: Diagnosis not present

## 2016-06-26 DIAGNOSIS — N2581 Secondary hyperparathyroidism of renal origin: Secondary | ICD-10-CM | POA: Diagnosis not present

## 2017-01-12 DIAGNOSIS — R05 Cough: Secondary | ICD-10-CM | POA: Diagnosis not present

## 2017-01-12 DIAGNOSIS — B9689 Other specified bacterial agents as the cause of diseases classified elsewhere: Secondary | ICD-10-CM | POA: Diagnosis not present

## 2017-01-12 DIAGNOSIS — J208 Acute bronchitis due to other specified organisms: Secondary | ICD-10-CM | POA: Diagnosis not present

## 2017-01-14 DIAGNOSIS — N183 Chronic kidney disease, stage 3 (moderate): Secondary | ICD-10-CM | POA: Diagnosis not present

## 2017-01-14 DIAGNOSIS — I129 Hypertensive chronic kidney disease with stage 1 through stage 4 chronic kidney disease, or unspecified chronic kidney disease: Secondary | ICD-10-CM | POA: Diagnosis not present

## 2017-01-14 DIAGNOSIS — N2581 Secondary hyperparathyroidism of renal origin: Secondary | ICD-10-CM | POA: Diagnosis not present

## 2017-04-10 DIAGNOSIS — Z131 Encounter for screening for diabetes mellitus: Secondary | ICD-10-CM | POA: Diagnosis not present

## 2017-04-10 DIAGNOSIS — M79672 Pain in left foot: Secondary | ICD-10-CM | POA: Insufficient documentation

## 2017-04-10 DIAGNOSIS — M79671 Pain in right foot: Secondary | ICD-10-CM | POA: Diagnosis not present

## 2017-04-10 DIAGNOSIS — K219 Gastro-esophageal reflux disease without esophagitis: Secondary | ICD-10-CM | POA: Diagnosis not present

## 2017-04-10 DIAGNOSIS — I10 Essential (primary) hypertension: Secondary | ICD-10-CM | POA: Diagnosis not present

## 2017-04-10 DIAGNOSIS — N183 Chronic kidney disease, stage 3 (moderate): Secondary | ICD-10-CM | POA: Diagnosis not present

## 2017-07-10 DIAGNOSIS — M1000 Idiopathic gout, unspecified site: Secondary | ICD-10-CM | POA: Diagnosis not present

## 2017-07-17 DIAGNOSIS — M1000 Idiopathic gout, unspecified site: Secondary | ICD-10-CM | POA: Diagnosis not present

## 2017-07-17 DIAGNOSIS — I10 Essential (primary) hypertension: Secondary | ICD-10-CM | POA: Diagnosis not present

## 2017-07-17 DIAGNOSIS — M109 Gout, unspecified: Secondary | ICD-10-CM | POA: Insufficient documentation

## 2017-07-17 DIAGNOSIS — K219 Gastro-esophageal reflux disease without esophagitis: Secondary | ICD-10-CM | POA: Diagnosis not present

## 2017-07-17 DIAGNOSIS — N183 Chronic kidney disease, stage 3 (moderate): Secondary | ICD-10-CM | POA: Diagnosis not present

## 2017-07-22 DIAGNOSIS — I129 Hypertensive chronic kidney disease with stage 1 through stage 4 chronic kidney disease, or unspecified chronic kidney disease: Secondary | ICD-10-CM | POA: Diagnosis not present

## 2017-07-22 DIAGNOSIS — N2581 Secondary hyperparathyroidism of renal origin: Secondary | ICD-10-CM | POA: Diagnosis not present

## 2017-07-22 DIAGNOSIS — N183 Chronic kidney disease, stage 3 (moderate): Secondary | ICD-10-CM | POA: Diagnosis not present

## 2017-07-22 DIAGNOSIS — M109 Gout, unspecified: Secondary | ICD-10-CM | POA: Diagnosis not present

## 2017-09-13 DIAGNOSIS — M5416 Radiculopathy, lumbar region: Secondary | ICD-10-CM | POA: Diagnosis not present

## 2017-09-13 DIAGNOSIS — M545 Low back pain: Secondary | ICD-10-CM | POA: Diagnosis not present

## 2017-09-14 DIAGNOSIS — M5416 Radiculopathy, lumbar region: Secondary | ICD-10-CM | POA: Insufficient documentation

## 2017-09-20 DIAGNOSIS — N183 Chronic kidney disease, stage 3 (moderate): Secondary | ICD-10-CM | POA: Diagnosis not present

## 2017-09-20 DIAGNOSIS — I10 Essential (primary) hypertension: Secondary | ICD-10-CM | POA: Diagnosis not present

## 2017-09-20 DIAGNOSIS — M5441 Lumbago with sciatica, right side: Secondary | ICD-10-CM | POA: Diagnosis not present

## 2017-10-08 DIAGNOSIS — L819 Disorder of pigmentation, unspecified: Secondary | ICD-10-CM | POA: Insufficient documentation

## 2017-10-08 DIAGNOSIS — I10 Essential (primary) hypertension: Secondary | ICD-10-CM | POA: Diagnosis not present

## 2017-10-08 DIAGNOSIS — K219 Gastro-esophageal reflux disease without esophagitis: Secondary | ICD-10-CM | POA: Diagnosis not present

## 2017-10-08 DIAGNOSIS — I739 Peripheral vascular disease, unspecified: Secondary | ICD-10-CM | POA: Diagnosis not present

## 2017-10-08 DIAGNOSIS — M1000 Idiopathic gout, unspecified site: Secondary | ICD-10-CM | POA: Diagnosis not present

## 2017-10-10 ENCOUNTER — Other Ambulatory Visit: Payer: Self-pay | Admitting: Internal Medicine

## 2017-10-10 DIAGNOSIS — I739 Peripheral vascular disease, unspecified: Secondary | ICD-10-CM

## 2017-10-10 DIAGNOSIS — L819 Disorder of pigmentation, unspecified: Secondary | ICD-10-CM

## 2017-10-11 ENCOUNTER — Ambulatory Visit (INDEPENDENT_AMBULATORY_CARE_PROVIDER_SITE_OTHER): Payer: Medicare HMO | Admitting: Vascular Surgery

## 2017-10-11 ENCOUNTER — Ambulatory Visit (INDEPENDENT_AMBULATORY_CARE_PROVIDER_SITE_OTHER): Payer: Medicare HMO

## 2017-10-11 ENCOUNTER — Other Ambulatory Visit (INDEPENDENT_AMBULATORY_CARE_PROVIDER_SITE_OTHER): Payer: Self-pay | Admitting: Internal Medicine

## 2017-10-11 ENCOUNTER — Encounter (INDEPENDENT_AMBULATORY_CARE_PROVIDER_SITE_OTHER): Payer: Self-pay | Admitting: Vascular Surgery

## 2017-10-11 VITALS — BP 156/80 | HR 85 | Resp 18 | Ht 63.5 in | Wt 256.0 lb

## 2017-10-11 DIAGNOSIS — M79605 Pain in left leg: Secondary | ICD-10-CM | POA: Diagnosis not present

## 2017-10-11 DIAGNOSIS — I739 Peripheral vascular disease, unspecified: Secondary | ICD-10-CM

## 2017-10-11 DIAGNOSIS — K439 Ventral hernia without obstruction or gangrene: Secondary | ICD-10-CM | POA: Diagnosis not present

## 2017-10-11 DIAGNOSIS — I10 Essential (primary) hypertension: Secondary | ICD-10-CM | POA: Diagnosis not present

## 2017-10-11 DIAGNOSIS — M79604 Pain in right leg: Secondary | ICD-10-CM | POA: Diagnosis not present

## 2017-10-11 DIAGNOSIS — M79609 Pain in unspecified limb: Secondary | ICD-10-CM | POA: Insufficient documentation

## 2017-10-11 NOTE — Assessment & Plan Note (Signed)
blood pressure control important in reducing the progression of atherosclerotic disease. On appropriate oral medications.  

## 2017-10-11 NOTE — Progress Notes (Signed)
Patient ID: Breanna Lawson, female   DOB: [birth year], 72 y.o.   MRN: [id number]  Chief Complaint  Patient presents with  . New Patient (Initial Visit)    Purple Toes/Claudication    HPI Breanna Lawson is a 72 y.o. female.  I am asked to see the patient by Dr. Candiss Norse for evaluation of painful discolored toes.  The patient reports 6-8 months of discoloration and pain in the toes.  The right foot is the more severely affected leg.  The big toe is the most severely affected toe on the right foot.  Activity does not necessarily make the pain worse or better.  There is some occasional swelling although.  She denies open ulceration or infection.  There is no trauma, injury, or inciting event that started the symptoms.  She does have a history of back problems and has previously had sciatica and injections.  She has never had back surgery.  She has no previous history of leg procedures to her knowledge. To assess her symptoms, noninvasive studies were performed today.  She had normal ABIs of greater than 1 bilaterally with normal triphasic waveforms bilaterally.  She does not appear to have any significant arterial insufficiency based on the studies today.   Past Medical History:  Diagnosis Date  . Arthritis   . Chronic kidney disease   . GERD (gastroesophageal reflux disease)   . Hypertension     Past Surgical History:  Procedure Laterality Date  . ANKLE SURGERY Left   . CHOLECYSTECTOMY    . COLONOSCOPY     more than 10 years ago.   Marland Kitchen DILATION AND CURETTAGE OF UTERUS    . INSERTION OF MESH N/A 03/25/2015   Procedure: INSERTION OF MESH;  Surgeon: Christene Lye, MD;  Location: [facility] ORS;  Service: General;  Laterality: N/A;  . VENTRAL HERNIA REPAIR N/A 03/25/2015   Procedure: HERNIA REPAIR VENTRAL ADULT;  Surgeon: Christene Lye, MD;  Location: [facility] ORS;  Service: General;  Laterality: N/A;    Family History  Problem Relation Age of Onset  . Heart failure Father   . Heart  attack Mother   . Breast cancer Sister      Social History Social History   Tobacco Use  . Smoking status: Never Smoker  . Smokeless tobacco: Never Used  Substance Use Topics  . Alcohol use: No    Alcohol/week: 0.0 oz  . Drug use: No     No Known Allergies  Current Outpatient Medications  Medication Sig Dispense Refill  . albuterol (PROVENTIL HFA) 108 (90 Base) MCG/ACT inhaler Inhale into the lungs.    Marland Kitchen allopurinol (ZYLOPRIM) 100 MG tablet     . aspirin 81 MG tablet Take 81 mg by mouth daily.    . calcium-vitamin D 250-100 MG-UNIT per tablet Take 1 tablet by mouth daily.     . colchicine 0.6 MG tablet Take by mouth.    . diclofenac (VOLTAREN) 75 MG EC tablet Take by mouth.    . indomethacin (INDOCIN) 25 MG capsule Take by mouth.    Marland Kitchen lisinopril-hydrochlorothiazide (PRINZIDE,ZESTORETIC) 20-25 MG per tablet Take 1 tablet by mouth daily.    Marland Kitchen losartan (COZAAR) 100 MG tablet     . Multiple Vitamin (MULTI-VITAMINS) TABS Take by mouth.    . predniSONE (DELTASONE) 20 MG tablet     . ranitidine (ZANTAC) 300 MG tablet Take 300 mg by mouth daily.     Marland Kitchen tiZANidine (ZANAFLEX) 2 MG tablet Take  by mouth.    . traMADol (ULTRAM) 50 MG tablet      No current facility-administered medications for this visit.       REVIEW OF SYSTEMS (Negative unless checked)  Constitutional: [] Weight loss  [] Fever  [] Chills Cardiac: [] Chest pain   [] Chest pressure   [] Palpitations   [] Shortness of breath when laying flat   [] Shortness of breath at rest   [] Shortness of breath with exertion. Vascular:  [] Pain in legs with walking   [] Pain in legs at rest   [] Pain in legs when laying flat   [] Claudication   [] Pain in feet when walking  [] Pain in feet at rest  [] Pain in feet when laying flat   [] History of DVT   [] Phlebitis   [] Swelling in legs   [] Varicose veins   [] Non-healing ulcers Pulmonary:   [] Uses home oxygen   [] Productive cough   [] Hemoptysis   [] Wheeze  [] COPD   [] Asthma Neurologic:  [] Dizziness   [] Blackouts   [] Seizures   [] History of stroke   [] History of TIA  [] Aphasia   [] Temporary blindness   [] Dysphagia   [] Weakness or numbness in arms   [x] Weakness or numbness in legs Musculoskeletal:  [x] Arthritis   [] Joint swelling   [] Joint pain   [x] Low back pain Hematologic:  [] Easy bruising  [] Easy bleeding   [] Hypercoagulable state   [] Anemic  [] Hepatitis Gastrointestinal:  [] Blood in stool   [] Vomiting blood  [x] Gastroesophageal reflux/heartburn   [] Abdominal pain Genitourinary:  [] Chronic kidney disease   [] Difficult urination  [] Frequent urination  [] Burning with urination   [] Hematuria Skin:  [] Rashes   [] Ulcers   [] Wounds Psychological:  [] History of anxiety   []  History of major depression.    Physical Exam BP (!) 156/80 (BP Location: Right Arm, Patient Position: Sitting)   Pulse 85   Resp 18   Ht 5' 3.5" (1.613 m)   Wt 256 lb (116.1 kg)   BMI 44.64 kg/m  Gen:  WD/WN, NAD.  Obese Head: [place]/AT, No temporalis wasting. Ear/Nose/Throat: Hearing grossly intact, nares w/o erythema or drainage, oropharynx w/o Erythema/Exudate Eyes: Conjunctiva clear, sclera non-icteric  Neck: trachea midline.  NoJVD.  Pulmonary:  Good air movement, clear to auscultation bilaterally.  Cardiac: RRR, no JVD Vascular:  Vessel Right Left  Radial Palpable Palpable                          PT Palpable Palpable  DP Palpable Palpable   Gastrointestinal: soft, non-tender/non-distended.  Musculoskeletal: M/S 5/5 throughout.  No deformity or atrophy.  Trace bilateral lower extremity edema.  Mild purplish discoloration of the right great and second toe Neurologic: Sensation grossly intact in extremities.  Symmetrical.  Speech is fluent. Motor exam as listed above. Psychiatric: Judgment intact, Mood & affect appropriate for pt's clinical situation. Dermatologic: No rashes or ulcers noted.  No cellulitis or open wounds.    Radiology No results found.  Labs No results found for this or any  previous visit (from the past 2160 hour(s)).  Assessment/Plan:  Ventral hernia Status post repair several years ago  Hypertension blood pressure control important in reducing the progression of atherosclerotic disease. On appropriate oral medications.   Pain in limb To assess her symptoms, noninvasive studies were performed today.  She had normal ABIs of greater than 1 bilaterally with normal triphasic waveforms bilaterally.  She does not appear to have any significant arterial insufficiency based on the studies today. It does not appears if her  lower extremity symptoms arterial insufficiency.  I suspect they are neuropathic in nature and may be related to her back and sciatic nerve issues.  No further vascular workup is necessary at this time.  She can return on an as-needed basis.      Leotis Pain 10/11/2017, 11:17 AM   This note was created with Dragon medical transcription system.  Any errors from dictation are unintentional.

## 2017-10-11 NOTE — Assessment & Plan Note (Signed)
To assess her symptoms, noninvasive studies were performed today.  She had normal ABIs of greater than 1 bilaterally with normal triphasic waveforms bilaterally.  She does not appear to have any significant arterial insufficiency based on the studies today. It does not appears if her lower extremity symptoms arterial insufficiency.  I suspect they are neuropathic in nature and may be related to her back and sciatic nerve issues.  No further vascular workup is necessary at this time.  She can return on an as-needed basis.

## 2017-10-11 NOTE — Assessment & Plan Note (Signed)
Status post repair several years ago

## 2017-10-17 ENCOUNTER — Ambulatory Visit: Payer: Commercial Managed Care - HMO

## 2017-10-22 DIAGNOSIS — Z0000 Encounter for general adult medical examination without abnormal findings: Secondary | ICD-10-CM | POA: Diagnosis not present

## 2017-10-22 DIAGNOSIS — Z780 Asymptomatic menopausal state: Secondary | ICD-10-CM | POA: Insufficient documentation

## 2017-10-22 DIAGNOSIS — Z23 Encounter for immunization: Secondary | ICD-10-CM | POA: Diagnosis not present

## 2017-10-22 DIAGNOSIS — Z1231 Encounter for screening mammogram for malignant neoplasm of breast: Secondary | ICD-10-CM | POA: Diagnosis not present

## 2017-10-22 DIAGNOSIS — M1000 Idiopathic gout, unspecified site: Secondary | ICD-10-CM | POA: Diagnosis not present

## 2017-10-22 DIAGNOSIS — I10 Essential (primary) hypertension: Secondary | ICD-10-CM | POA: Diagnosis not present

## 2017-10-23 ENCOUNTER — Other Ambulatory Visit: Payer: Self-pay | Admitting: Internal Medicine

## 2017-10-23 DIAGNOSIS — Z1239 Encounter for other screening for malignant neoplasm of breast: Secondary | ICD-10-CM

## 2017-10-30 DIAGNOSIS — Z780 Asymptomatic menopausal state: Secondary | ICD-10-CM | POA: Diagnosis not present

## 2017-10-30 DIAGNOSIS — M8588 Other specified disorders of bone density and structure, other site: Secondary | ICD-10-CM | POA: Diagnosis not present

## 2017-11-15 ENCOUNTER — Ambulatory Visit
Admission: RE | Admit: 2017-11-15 | Discharge: 2017-11-15 | Disposition: A | Discharge status: Home / Self Care | Payer: Medicare HMO | Source: Ambulatory Visit | Attending: Internal Medicine | Admitting: Internal Medicine

## 2017-11-15 DIAGNOSIS — Z1239 Encounter for other screening for malignant neoplasm of breast: Secondary | ICD-10-CM

## 2017-11-15 DIAGNOSIS — Z1231 Encounter for screening mammogram for malignant neoplasm of breast: Secondary | ICD-10-CM | POA: Insufficient documentation

## 2017-12-11 DIAGNOSIS — L0293 Carbuncle, unspecified: Secondary | ICD-10-CM | POA: Diagnosis not present

## 2017-12-27 DIAGNOSIS — Z6841 Body Mass Index (BMI) 40.0 and over, adult: Secondary | ICD-10-CM | POA: Insufficient documentation

## 2017-12-27 DIAGNOSIS — M1000 Idiopathic gout, unspecified site: Secondary | ICD-10-CM | POA: Diagnosis not present

## 2017-12-27 DIAGNOSIS — L03031 Cellulitis of right toe: Secondary | ICD-10-CM | POA: Diagnosis not present

## 2018-01-06 DIAGNOSIS — M1000 Idiopathic gout, unspecified site: Secondary | ICD-10-CM | POA: Diagnosis not present

## 2018-01-06 DIAGNOSIS — L819 Disorder of pigmentation, unspecified: Secondary | ICD-10-CM | POA: Diagnosis not present

## 2018-02-03 DIAGNOSIS — N183 Chronic kidney disease, stage 3 (moderate): Secondary | ICD-10-CM | POA: Diagnosis not present

## 2018-02-03 DIAGNOSIS — D631 Anemia in chronic kidney disease: Secondary | ICD-10-CM | POA: Diagnosis not present

## 2018-02-03 DIAGNOSIS — I129 Hypertensive chronic kidney disease with stage 1 through stage 4 chronic kidney disease, or unspecified chronic kidney disease: Secondary | ICD-10-CM | POA: Diagnosis not present

## 2018-02-03 DIAGNOSIS — N2581 Secondary hyperparathyroidism of renal origin: Secondary | ICD-10-CM | POA: Diagnosis not present

## 2018-02-03 DIAGNOSIS — M109 Gout, unspecified: Secondary | ICD-10-CM | POA: Diagnosis not present

## 2018-02-03 DIAGNOSIS — D649 Anemia, unspecified: Secondary | ICD-10-CM | POA: Diagnosis not present

## 2018-02-03 DIAGNOSIS — G629 Polyneuropathy, unspecified: Secondary | ICD-10-CM | POA: Diagnosis not present

## 2018-04-14 DIAGNOSIS — I10 Essential (primary) hypertension: Secondary | ICD-10-CM | POA: Diagnosis not present

## 2018-04-14 DIAGNOSIS — M1000 Idiopathic gout, unspecified site: Secondary | ICD-10-CM | POA: Diagnosis not present

## 2018-04-21 DIAGNOSIS — M79671 Pain in right foot: Secondary | ICD-10-CM | POA: Diagnosis not present

## 2018-04-21 DIAGNOSIS — M79672 Pain in left foot: Secondary | ICD-10-CM | POA: Diagnosis not present

## 2018-04-21 DIAGNOSIS — M1000 Idiopathic gout, unspecified site: Secondary | ICD-10-CM | POA: Diagnosis not present

## 2018-04-21 DIAGNOSIS — I10 Essential (primary) hypertension: Secondary | ICD-10-CM | POA: Diagnosis not present

## 2018-04-21 DIAGNOSIS — M25473 Effusion, unspecified ankle: Secondary | ICD-10-CM | POA: Diagnosis not present

## 2018-04-24 DIAGNOSIS — M25473 Effusion, unspecified ankle: Secondary | ICD-10-CM | POA: Insufficient documentation

## 2018-05-08 DIAGNOSIS — M7742 Metatarsalgia, left foot: Secondary | ICD-10-CM | POA: Diagnosis not present

## 2018-05-08 DIAGNOSIS — M7741 Metatarsalgia, right foot: Secondary | ICD-10-CM | POA: Diagnosis not present

## 2018-06-12 DIAGNOSIS — R600 Localized edema: Secondary | ICD-10-CM | POA: Diagnosis not present

## 2018-07-21 DIAGNOSIS — M5416 Radiculopathy, lumbar region: Secondary | ICD-10-CM | POA: Diagnosis not present

## 2018-07-21 DIAGNOSIS — K219 Gastro-esophageal reflux disease without esophagitis: Secondary | ICD-10-CM | POA: Diagnosis not present

## 2018-07-21 DIAGNOSIS — N183 Chronic kidney disease, stage 3 (moderate): Secondary | ICD-10-CM | POA: Diagnosis not present

## 2018-07-21 DIAGNOSIS — I10 Essential (primary) hypertension: Secondary | ICD-10-CM | POA: Diagnosis not present

## 2018-07-26 DIAGNOSIS — I10 Essential (primary) hypertension: Secondary | ICD-10-CM | POA: Insufficient documentation

## 2018-08-20 DIAGNOSIS — M5416 Radiculopathy, lumbar region: Secondary | ICD-10-CM | POA: Diagnosis not present

## 2018-08-20 DIAGNOSIS — I10 Essential (primary) hypertension: Secondary | ICD-10-CM | POA: Diagnosis not present

## 2018-08-20 DIAGNOSIS — M1000 Idiopathic gout, unspecified site: Secondary | ICD-10-CM | POA: Diagnosis not present

## 2018-08-29 DIAGNOSIS — M79671 Pain in right foot: Secondary | ICD-10-CM | POA: Diagnosis not present

## 2018-08-29 DIAGNOSIS — M25473 Effusion, unspecified ankle: Secondary | ICD-10-CM | POA: Diagnosis not present

## 2018-08-29 DIAGNOSIS — M5416 Radiculopathy, lumbar region: Secondary | ICD-10-CM | POA: Diagnosis not present

## 2018-10-06 DIAGNOSIS — I129 Hypertensive chronic kidney disease with stage 1 through stage 4 chronic kidney disease, or unspecified chronic kidney disease: Secondary | ICD-10-CM | POA: Diagnosis not present

## 2018-10-06 DIAGNOSIS — M109 Gout, unspecified: Secondary | ICD-10-CM | POA: Diagnosis not present

## 2018-10-06 DIAGNOSIS — N2581 Secondary hyperparathyroidism of renal origin: Secondary | ICD-10-CM | POA: Diagnosis not present

## 2018-10-06 DIAGNOSIS — N183 Chronic kidney disease, stage 3 (moderate): Secondary | ICD-10-CM | POA: Diagnosis not present

## 2018-10-06 DIAGNOSIS — R601 Generalized edema: Secondary | ICD-10-CM | POA: Diagnosis not present

## 2018-10-15 DIAGNOSIS — M1000 Idiopathic gout, unspecified site: Secondary | ICD-10-CM | POA: Diagnosis not present

## 2018-10-15 DIAGNOSIS — I10 Essential (primary) hypertension: Secondary | ICD-10-CM | POA: Diagnosis not present

## 2018-10-16 DIAGNOSIS — M1000 Idiopathic gout, unspecified site: Secondary | ICD-10-CM | POA: Diagnosis not present

## 2018-10-16 DIAGNOSIS — I10 Essential (primary) hypertension: Secondary | ICD-10-CM | POA: Diagnosis not present

## 2018-10-22 DIAGNOSIS — I10 Essential (primary) hypertension: Secondary | ICD-10-CM | POA: Diagnosis not present

## 2018-10-22 DIAGNOSIS — N183 Chronic kidney disease, stage 3 (moderate): Secondary | ICD-10-CM | POA: Diagnosis not present

## 2018-11-18 DIAGNOSIS — Z6841 Body Mass Index (BMI) 40.0 and over, adult: Secondary | ICD-10-CM | POA: Diagnosis not present

## 2018-11-18 DIAGNOSIS — M7989 Other specified soft tissue disorders: Secondary | ICD-10-CM | POA: Diagnosis not present

## 2018-11-18 DIAGNOSIS — I10 Essential (primary) hypertension: Secondary | ICD-10-CM | POA: Diagnosis not present

## 2018-11-18 DIAGNOSIS — M79671 Pain in right foot: Secondary | ICD-10-CM | POA: Diagnosis not present

## 2018-12-10 DIAGNOSIS — M7751 Other enthesopathy of right foot: Secondary | ICD-10-CM | POA: Diagnosis not present

## 2018-12-10 DIAGNOSIS — M7752 Other enthesopathy of left foot: Secondary | ICD-10-CM | POA: Diagnosis not present

## 2019-01-07 DIAGNOSIS — M7752 Other enthesopathy of left foot: Secondary | ICD-10-CM | POA: Diagnosis not present

## 2019-01-07 DIAGNOSIS — M7751 Other enthesopathy of right foot: Secondary | ICD-10-CM | POA: Diagnosis not present

## 2019-01-28 DIAGNOSIS — M7752 Other enthesopathy of left foot: Secondary | ICD-10-CM | POA: Diagnosis not present

## 2019-01-28 DIAGNOSIS — M7751 Other enthesopathy of right foot: Secondary | ICD-10-CM | POA: Diagnosis not present

## 2019-02-17 ENCOUNTER — Other Ambulatory Visit: Payer: Self-pay | Admitting: Physician Assistant

## 2019-02-17 DIAGNOSIS — Z6841 Body Mass Index (BMI) 40.0 and over, adult: Secondary | ICD-10-CM | POA: Diagnosis not present

## 2019-02-17 DIAGNOSIS — M1711 Unilateral primary osteoarthritis, right knee: Secondary | ICD-10-CM | POA: Diagnosis not present

## 2019-02-17 DIAGNOSIS — M25473 Effusion, unspecified ankle: Secondary | ICD-10-CM | POA: Diagnosis not present

## 2019-02-17 DIAGNOSIS — Z1239 Encounter for other screening for malignant neoplasm of breast: Secondary | ICD-10-CM | POA: Diagnosis not present

## 2019-02-17 DIAGNOSIS — Z1231 Encounter for screening mammogram for malignant neoplasm of breast: Secondary | ICD-10-CM

## 2019-02-17 DIAGNOSIS — Z0000 Encounter for general adult medical examination without abnormal findings: Secondary | ICD-10-CM | POA: Diagnosis not present

## 2019-02-17 DIAGNOSIS — I10 Essential (primary) hypertension: Secondary | ICD-10-CM | POA: Diagnosis not present

## 2019-02-17 DIAGNOSIS — M5416 Radiculopathy, lumbar region: Secondary | ICD-10-CM | POA: Diagnosis not present

## 2019-02-17 DIAGNOSIS — M25561 Pain in right knee: Secondary | ICD-10-CM | POA: Diagnosis not present

## 2019-02-17 DIAGNOSIS — N183 Chronic kidney disease, stage 3 (moderate): Secondary | ICD-10-CM | POA: Diagnosis not present

## 2019-02-17 DIAGNOSIS — M1000 Idiopathic gout, unspecified site: Secondary | ICD-10-CM | POA: Diagnosis not present

## 2019-02-26 DIAGNOSIS — M25561 Pain in right knee: Secondary | ICD-10-CM | POA: Diagnosis not present

## 2019-02-26 DIAGNOSIS — G8929 Other chronic pain: Secondary | ICD-10-CM | POA: Diagnosis not present

## 2019-02-26 DIAGNOSIS — M170 Bilateral primary osteoarthritis of knee: Secondary | ICD-10-CM | POA: Diagnosis not present

## 2019-02-26 DIAGNOSIS — M25562 Pain in left knee: Secondary | ICD-10-CM | POA: Diagnosis not present

## 2019-03-11 DIAGNOSIS — M7751 Other enthesopathy of right foot: Secondary | ICD-10-CM | POA: Diagnosis not present

## 2019-03-11 DIAGNOSIS — M7752 Other enthesopathy of left foot: Secondary | ICD-10-CM | POA: Diagnosis not present

## 2019-04-13 DIAGNOSIS — I129 Hypertensive chronic kidney disease with stage 1 through stage 4 chronic kidney disease, or unspecified chronic kidney disease: Secondary | ICD-10-CM | POA: Diagnosis not present

## 2019-04-13 DIAGNOSIS — M109 Gout, unspecified: Secondary | ICD-10-CM | POA: Diagnosis not present

## 2019-04-13 DIAGNOSIS — R8290 Unspecified abnormal findings in urine: Secondary | ICD-10-CM | POA: Diagnosis not present

## 2019-04-13 DIAGNOSIS — N183 Chronic kidney disease, stage 3 (moderate): Secondary | ICD-10-CM | POA: Diagnosis not present

## 2019-04-13 DIAGNOSIS — N2581 Secondary hyperparathyroidism of renal origin: Secondary | ICD-10-CM | POA: Diagnosis not present

## 2019-05-04 DIAGNOSIS — H25013 Cortical age-related cataract, bilateral: Secondary | ICD-10-CM | POA: Diagnosis not present

## 2019-05-04 DIAGNOSIS — H40013 Open angle with borderline findings, low risk, bilateral: Secondary | ICD-10-CM | POA: Diagnosis not present

## 2019-05-04 DIAGNOSIS — H2513 Age-related nuclear cataract, bilateral: Secondary | ICD-10-CM | POA: Diagnosis not present

## 2019-05-04 DIAGNOSIS — H35372 Puckering of macula, left eye: Secondary | ICD-10-CM | POA: Diagnosis not present

## 2019-05-12 DIAGNOSIS — M7752 Other enthesopathy of left foot: Secondary | ICD-10-CM | POA: Diagnosis not present

## 2019-05-12 DIAGNOSIS — R600 Localized edema: Secondary | ICD-10-CM | POA: Diagnosis not present

## 2019-05-12 DIAGNOSIS — M7751 Other enthesopathy of right foot: Secondary | ICD-10-CM | POA: Diagnosis not present

## 2019-05-13 DIAGNOSIS — I10 Essential (primary) hypertension: Secondary | ICD-10-CM | POA: Diagnosis not present

## 2019-05-13 DIAGNOSIS — N183 Chronic kidney disease, stage 3 (moderate): Secondary | ICD-10-CM | POA: Diagnosis not present

## 2019-05-13 DIAGNOSIS — Z0000 Encounter for general adult medical examination without abnormal findings: Secondary | ICD-10-CM | POA: Diagnosis not present

## 2019-05-13 DIAGNOSIS — R7303 Prediabetes: Secondary | ICD-10-CM | POA: Diagnosis not present

## 2019-05-20 DIAGNOSIS — M5416 Radiculopathy, lumbar region: Secondary | ICD-10-CM | POA: Diagnosis not present

## 2019-05-20 DIAGNOSIS — M25473 Effusion, unspecified ankle: Secondary | ICD-10-CM | POA: Diagnosis not present

## 2019-05-20 DIAGNOSIS — I10 Essential (primary) hypertension: Secondary | ICD-10-CM | POA: Diagnosis not present

## 2019-05-20 DIAGNOSIS — M1711 Unilateral primary osteoarthritis, right knee: Secondary | ICD-10-CM | POA: Diagnosis not present

## 2019-05-20 DIAGNOSIS — R7303 Prediabetes: Secondary | ICD-10-CM | POA: Diagnosis not present

## 2019-05-20 DIAGNOSIS — M79672 Pain in left foot: Secondary | ICD-10-CM | POA: Diagnosis not present

## 2019-05-20 DIAGNOSIS — M79671 Pain in right foot: Secondary | ICD-10-CM | POA: Diagnosis not present

## 2019-05-20 DIAGNOSIS — N183 Chronic kidney disease, stage 3 (moderate): Secondary | ICD-10-CM | POA: Diagnosis not present

## 2019-05-23 DIAGNOSIS — N183 Chronic kidney disease, stage 3 (moderate): Secondary | ICD-10-CM | POA: Diagnosis not present

## 2019-05-23 DIAGNOSIS — M1711 Unilateral primary osteoarthritis, right knee: Secondary | ICD-10-CM | POA: Diagnosis not present

## 2019-06-02 DIAGNOSIS — M170 Bilateral primary osteoarthritis of knee: Secondary | ICD-10-CM | POA: Diagnosis not present

## 2019-06-02 DIAGNOSIS — M1711 Unilateral primary osteoarthritis, right knee: Secondary | ICD-10-CM | POA: Diagnosis not present

## 2019-06-02 DIAGNOSIS — M25562 Pain in left knee: Secondary | ICD-10-CM | POA: Diagnosis not present

## 2019-06-02 DIAGNOSIS — M25561 Pain in right knee: Secondary | ICD-10-CM | POA: Diagnosis not present

## 2019-06-02 DIAGNOSIS — G8929 Other chronic pain: Secondary | ICD-10-CM | POA: Diagnosis not present

## 2019-06-02 DIAGNOSIS — M1712 Unilateral primary osteoarthritis, left knee: Secondary | ICD-10-CM | POA: Diagnosis not present

## 2019-06-12 ENCOUNTER — Other Ambulatory Visit: Payer: Self-pay

## 2019-06-12 ENCOUNTER — Ambulatory Visit
Admission: RE | Admit: 2019-06-12 | Discharge: 2019-06-12 | Disposition: A | Discharge status: Home / Self Care | Payer: Medicare HMO | Source: Ambulatory Visit | Attending: Physician Assistant | Admitting: Physician Assistant

## 2019-06-12 DIAGNOSIS — Z1231 Encounter for screening mammogram for malignant neoplasm of breast: Secondary | ICD-10-CM | POA: Insufficient documentation

## 2019-06-18 ENCOUNTER — Other Ambulatory Visit: Payer: Self-pay | Admitting: Physician Assistant

## 2019-06-18 DIAGNOSIS — N6310 Unspecified lump in the right breast, unspecified quadrant: Secondary | ICD-10-CM

## 2019-06-18 DIAGNOSIS — R928 Other abnormal and inconclusive findings on diagnostic imaging of breast: Secondary | ICD-10-CM

## 2019-06-22 ENCOUNTER — Ambulatory Visit
Admission: RE | Admit: 2019-06-22 | Discharge: 2019-06-22 | Disposition: A | Discharge status: Home / Self Care | Payer: Medicare HMO | Source: Ambulatory Visit | Attending: Physician Assistant | Admitting: Physician Assistant

## 2019-06-22 DIAGNOSIS — R928 Other abnormal and inconclusive findings on diagnostic imaging of breast: Secondary | ICD-10-CM | POA: Diagnosis not present

## 2019-06-22 DIAGNOSIS — N6001 Solitary cyst of right breast: Secondary | ICD-10-CM | POA: Diagnosis not present

## 2019-06-22 DIAGNOSIS — N6310 Unspecified lump in the right breast, unspecified quadrant: Secondary | ICD-10-CM | POA: Diagnosis not present

## 2019-06-24 DIAGNOSIS — R600 Localized edema: Secondary | ICD-10-CM | POA: Diagnosis not present

## 2019-06-24 DIAGNOSIS — M7751 Other enthesopathy of right foot: Secondary | ICD-10-CM | POA: Diagnosis not present

## 2019-06-24 DIAGNOSIS — M7752 Other enthesopathy of left foot: Secondary | ICD-10-CM | POA: Diagnosis not present

## 2019-08-11 DIAGNOSIS — M7051 Other bursitis of knee, right knee: Secondary | ICD-10-CM | POA: Diagnosis not present

## 2019-08-11 DIAGNOSIS — M170 Bilateral primary osteoarthritis of knee: Secondary | ICD-10-CM | POA: Diagnosis not present

## 2019-08-11 DIAGNOSIS — G8929 Other chronic pain: Secondary | ICD-10-CM | POA: Diagnosis not present

## 2019-08-11 DIAGNOSIS — M25561 Pain in right knee: Secondary | ICD-10-CM | POA: Diagnosis not present

## 2019-08-11 DIAGNOSIS — M1711 Unilateral primary osteoarthritis, right knee: Secondary | ICD-10-CM | POA: Diagnosis not present

## 2019-08-11 DIAGNOSIS — M25562 Pain in left knee: Secondary | ICD-10-CM | POA: Diagnosis not present

## 2019-08-11 DIAGNOSIS — M7052 Other bursitis of knee, left knee: Secondary | ICD-10-CM | POA: Diagnosis not present

## 2019-08-11 DIAGNOSIS — M1712 Unilateral primary osteoarthritis, left knee: Secondary | ICD-10-CM | POA: Diagnosis not present

## 2019-08-20 DIAGNOSIS — I129 Hypertensive chronic kidney disease with stage 1 through stage 4 chronic kidney disease, or unspecified chronic kidney disease: Secondary | ICD-10-CM | POA: Diagnosis not present

## 2019-08-20 DIAGNOSIS — R7303 Prediabetes: Secondary | ICD-10-CM | POA: Diagnosis not present

## 2019-08-20 DIAGNOSIS — H269 Unspecified cataract: Secondary | ICD-10-CM | POA: Diagnosis not present

## 2019-08-20 DIAGNOSIS — M5136 Other intervertebral disc degeneration, lumbar region: Secondary | ICD-10-CM | POA: Diagnosis not present

## 2019-08-20 DIAGNOSIS — M79671 Pain in right foot: Secondary | ICD-10-CM | POA: Diagnosis not present

## 2019-08-20 DIAGNOSIS — M1000 Idiopathic gout, unspecified site: Secondary | ICD-10-CM | POA: Diagnosis not present

## 2019-08-20 DIAGNOSIS — M170 Bilateral primary osteoarthritis of knee: Secondary | ICD-10-CM | POA: Diagnosis not present

## 2019-08-20 DIAGNOSIS — N183 Chronic kidney disease, stage 3 unspecified: Secondary | ICD-10-CM | POA: Diagnosis not present

## 2019-08-20 DIAGNOSIS — K219 Gastro-esophageal reflux disease without esophagitis: Secondary | ICD-10-CM | POA: Diagnosis not present

## 2019-10-01 DIAGNOSIS — H35033 Hypertensive retinopathy, bilateral: Secondary | ICD-10-CM | POA: Diagnosis not present

## 2019-10-01 DIAGNOSIS — H25013 Cortical age-related cataract, bilateral: Secondary | ICD-10-CM | POA: Diagnosis not present

## 2019-10-01 DIAGNOSIS — H2513 Age-related nuclear cataract, bilateral: Secondary | ICD-10-CM | POA: Diagnosis not present

## 2019-10-01 DIAGNOSIS — H2512 Age-related nuclear cataract, left eye: Secondary | ICD-10-CM | POA: Diagnosis not present

## 2019-10-01 DIAGNOSIS — H35373 Puckering of macula, bilateral: Secondary | ICD-10-CM | POA: Diagnosis not present

## 2019-10-01 DIAGNOSIS — H35372 Puckering of macula, left eye: Secondary | ICD-10-CM | POA: Diagnosis not present

## 2019-10-20 DIAGNOSIS — H25812 Combined forms of age-related cataract, left eye: Secondary | ICD-10-CM | POA: Diagnosis not present

## 2019-10-20 DIAGNOSIS — H2512 Age-related nuclear cataract, left eye: Secondary | ICD-10-CM | POA: Diagnosis not present

## 2019-10-26 DIAGNOSIS — N1831 Chronic kidney disease, stage 3a: Secondary | ICD-10-CM | POA: Insufficient documentation

## 2019-10-26 DIAGNOSIS — I7300 Raynaud's syndrome without gangrene: Secondary | ICD-10-CM | POA: Diagnosis not present

## 2019-10-26 DIAGNOSIS — R8290 Unspecified abnormal findings in urine: Secondary | ICD-10-CM | POA: Diagnosis not present

## 2019-10-26 DIAGNOSIS — I129 Hypertensive chronic kidney disease with stage 1 through stage 4 chronic kidney disease, or unspecified chronic kidney disease: Secondary | ICD-10-CM | POA: Diagnosis not present

## 2019-10-26 DIAGNOSIS — N2581 Secondary hyperparathyroidism of renal origin: Secondary | ICD-10-CM | POA: Diagnosis not present

## 2019-10-26 DIAGNOSIS — M109 Gout, unspecified: Secondary | ICD-10-CM | POA: Insufficient documentation

## 2019-10-26 DIAGNOSIS — M10379 Gout due to renal impairment, unspecified ankle and foot: Secondary | ICD-10-CM | POA: Diagnosis not present

## 2019-10-26 IMAGING — MG MM DIGITAL SCREENING BILAT W/ TOMO W/ CAD
6 of 12 series · 6 of 36 positions shown · non-contrast
Comparison: Previous exam(s).

CLINICAL DATA: Screening.

EXAM:
DIGITAL SCREENING BILATERAL MAMMOGRAM WITH TOMO AND CAD

[L XCCL synth-2D]
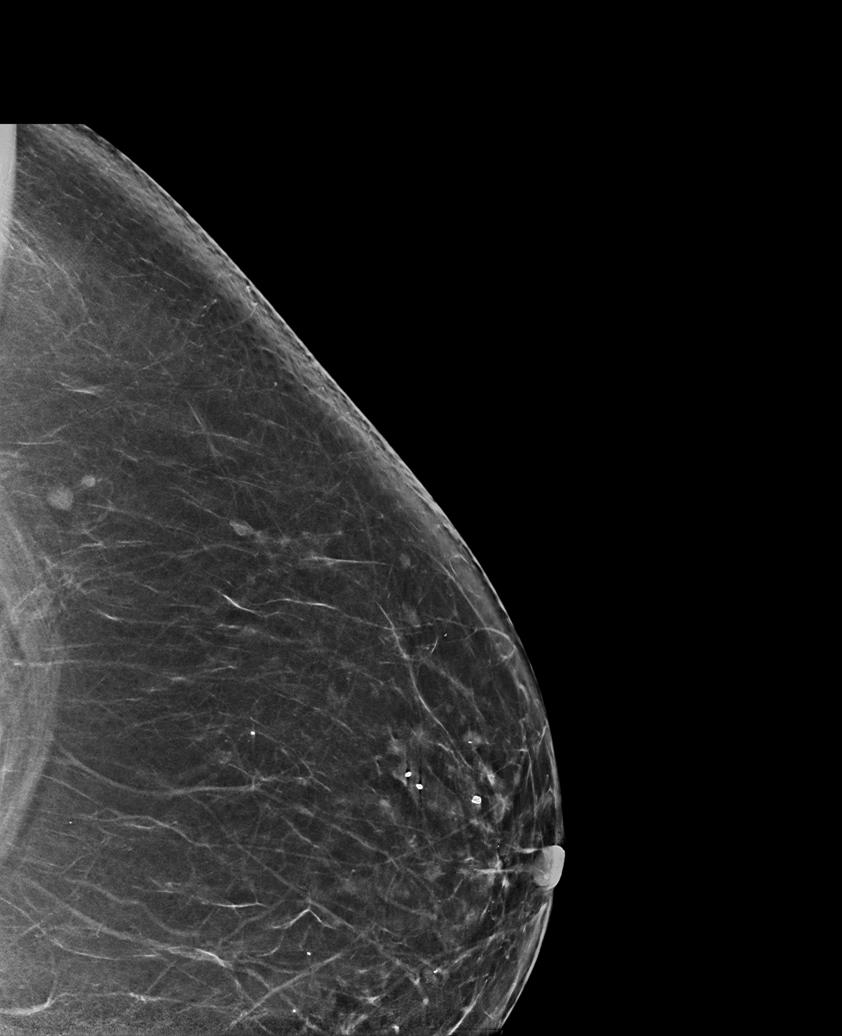

[R CC synth-2D]
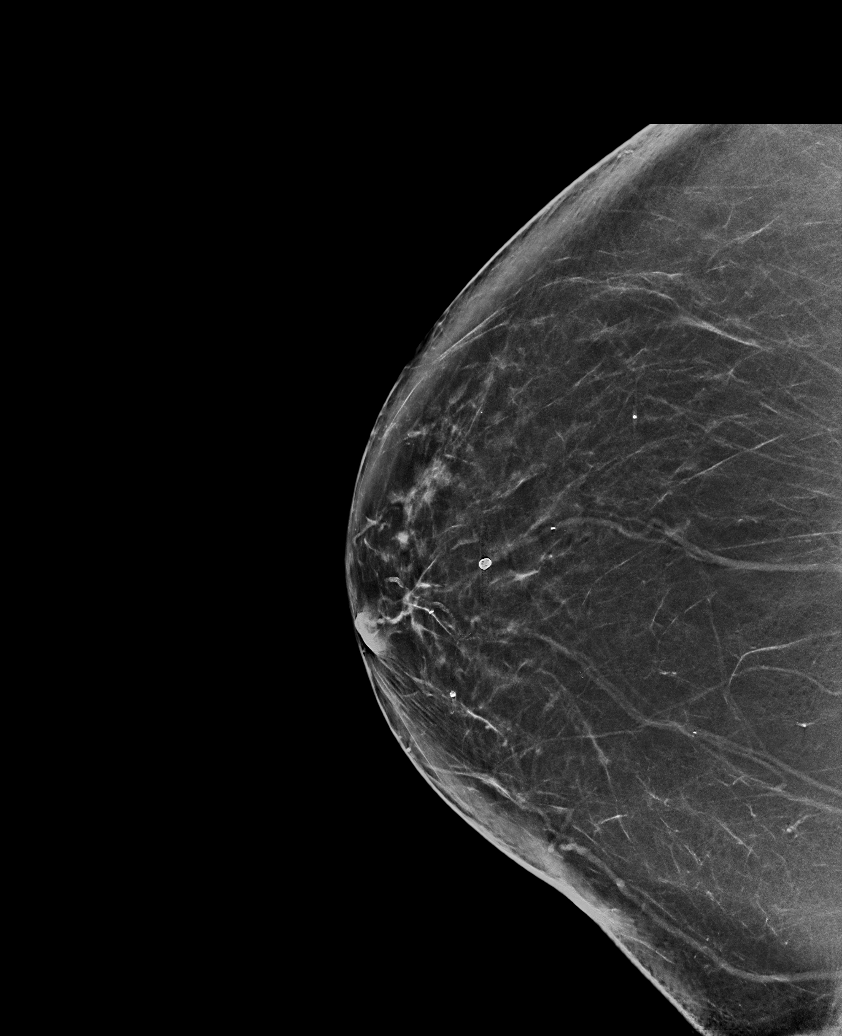

[L MLO synth-2D (1 of 2)]
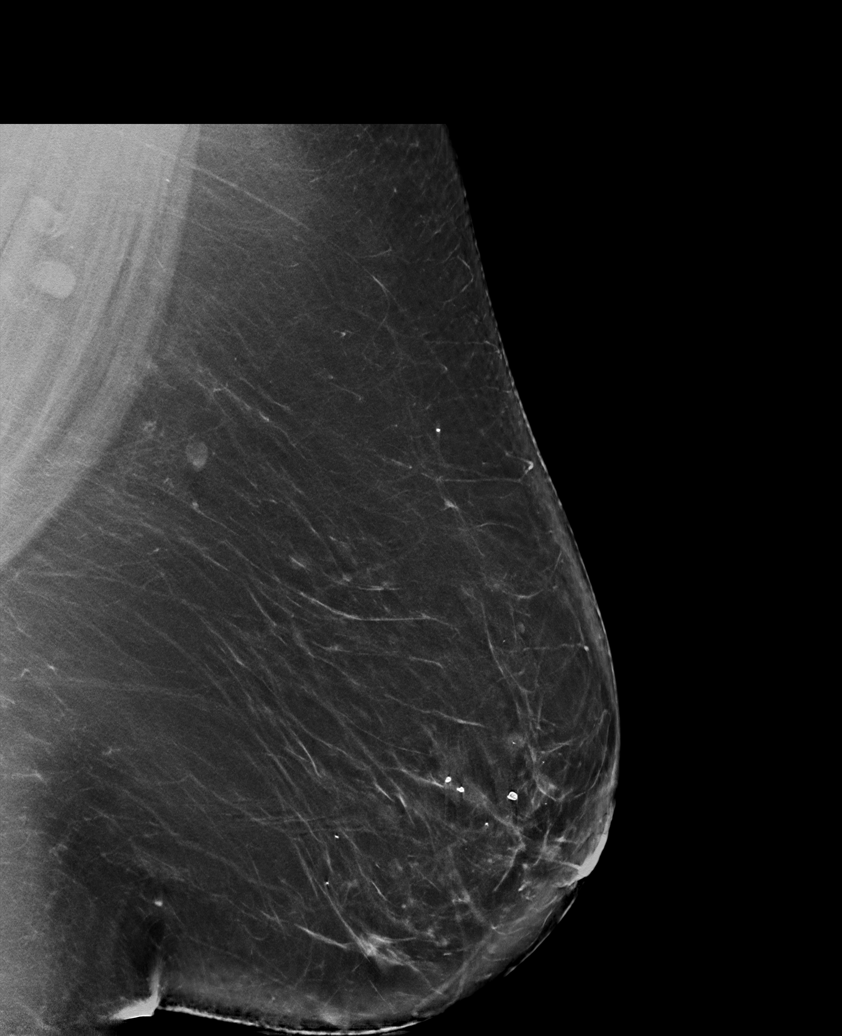

[R MLO synth-2D]
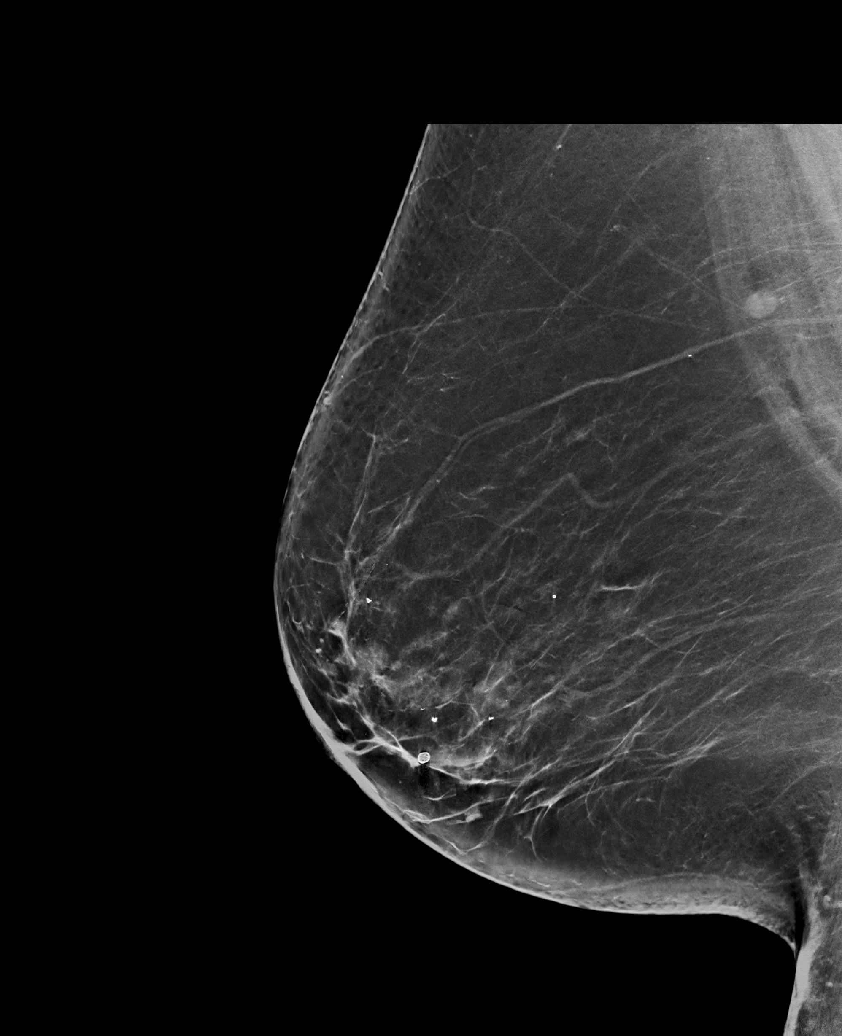

[L MLO synth-2D (2 of 2)]
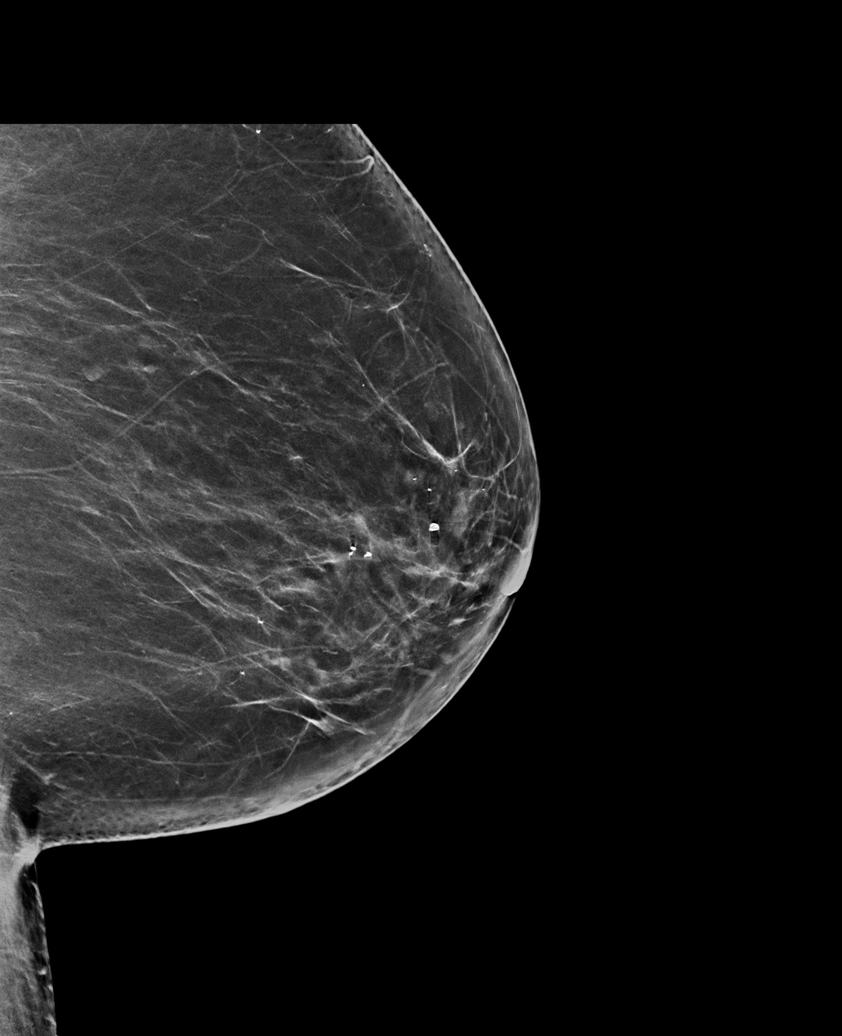

[L CC synth-2D]
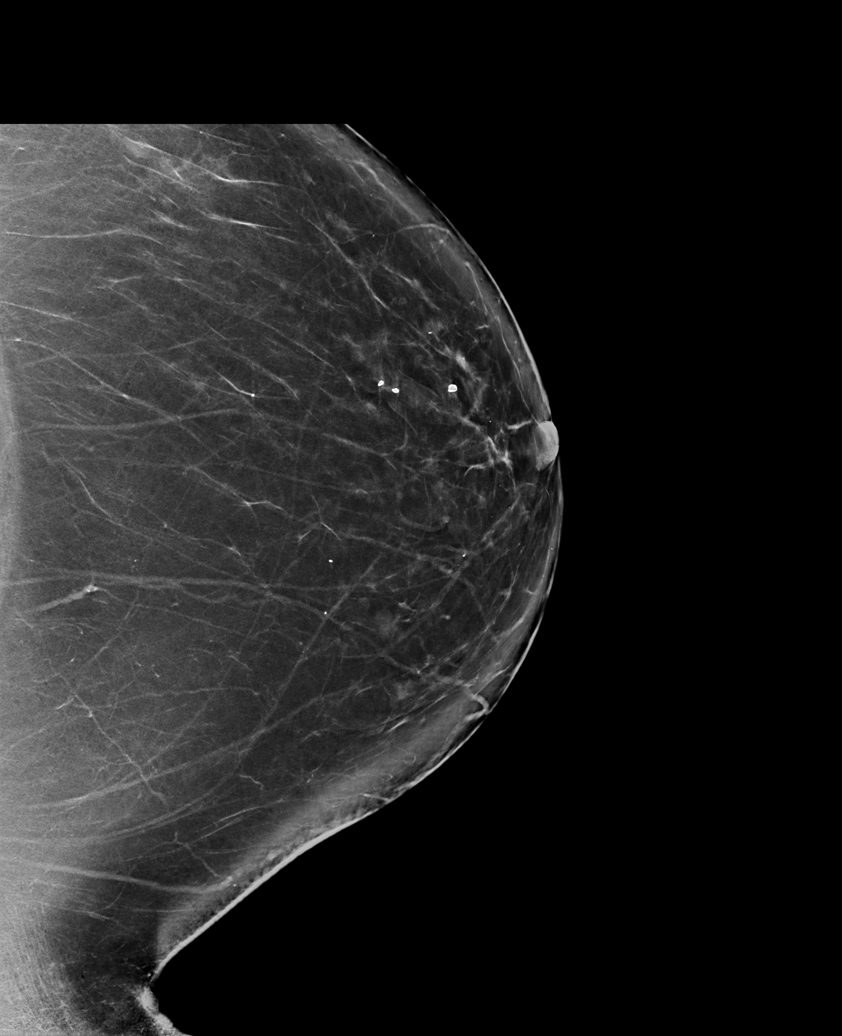

[6 of 36 positions shown; findings below may reference images not displayed]

ACR Breast Density Category b: There are scattered areas of
fibroglandular density.
FINDINGS: In the right breast, a possible mass warrants further evaluation.
This possible mass is seen within the upper outer quadrant of the
RIGHT breast, at anterior depth, CC slice 74 and MLO slice 71.

In the left breast, no findings suspicious for malignancy. Images
were processed with CAD.
IMPRESSION: Further evaluation is suggested for possible mass in the right
breast.

RECOMMENDATION:
Ultrasound of the right breast. (Code:[id number])

The patient will be contacted regarding the findings, and additional
imaging will be scheduled.

BI-RADS CATEGORY  0: Incomplete. Need additional imaging evaluation
and/or prior mammograms for comparison.

## 2019-10-27 DIAGNOSIS — H2512 Age-related nuclear cataract, left eye: Secondary | ICD-10-CM | POA: Diagnosis not present

## 2019-11-05 IMAGING — US US BREAST*R* LIMITED INC AXILLA
1 series · 5 of 5 positions shown · non-contrast
Comparison: Previous exam(s).

CLINICAL DATA: Recall from recent screening mammogram to evaluate a
mass over the outer right periareolar region.

EXAM:
ULTRASOUND OF THE RIGHT BREAST

[Series 1: us breast*right* limited inc axilla · 0.06mm/px · 5 of 5 slices shown]
[im 1/5]
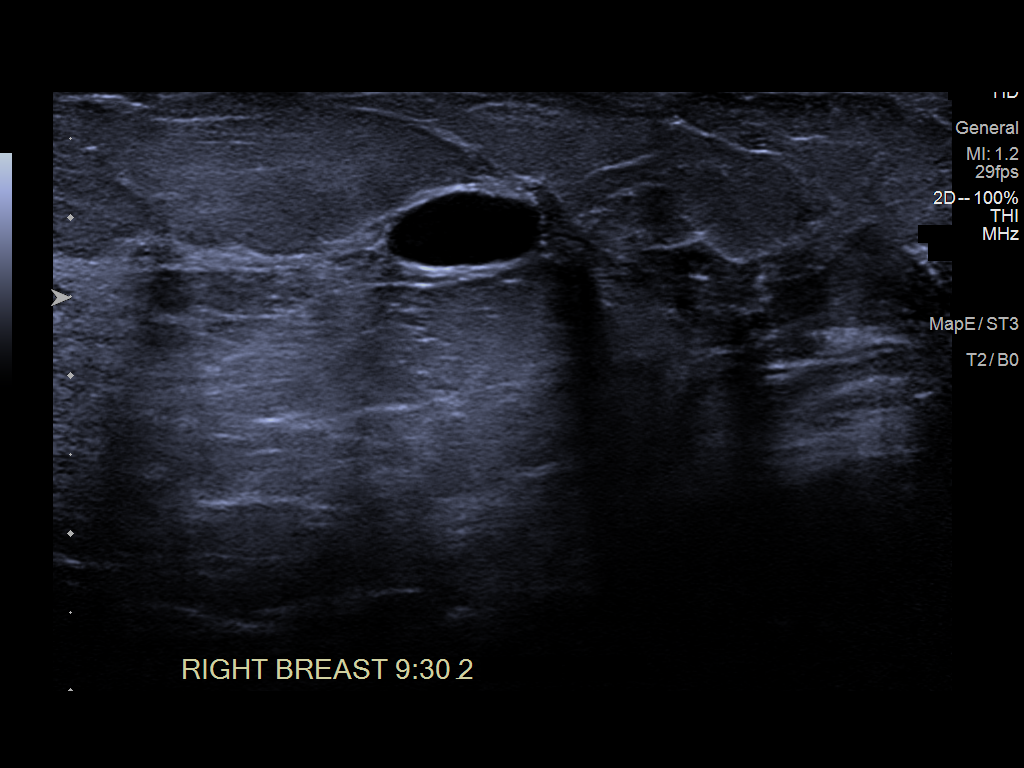
[im 2/5]
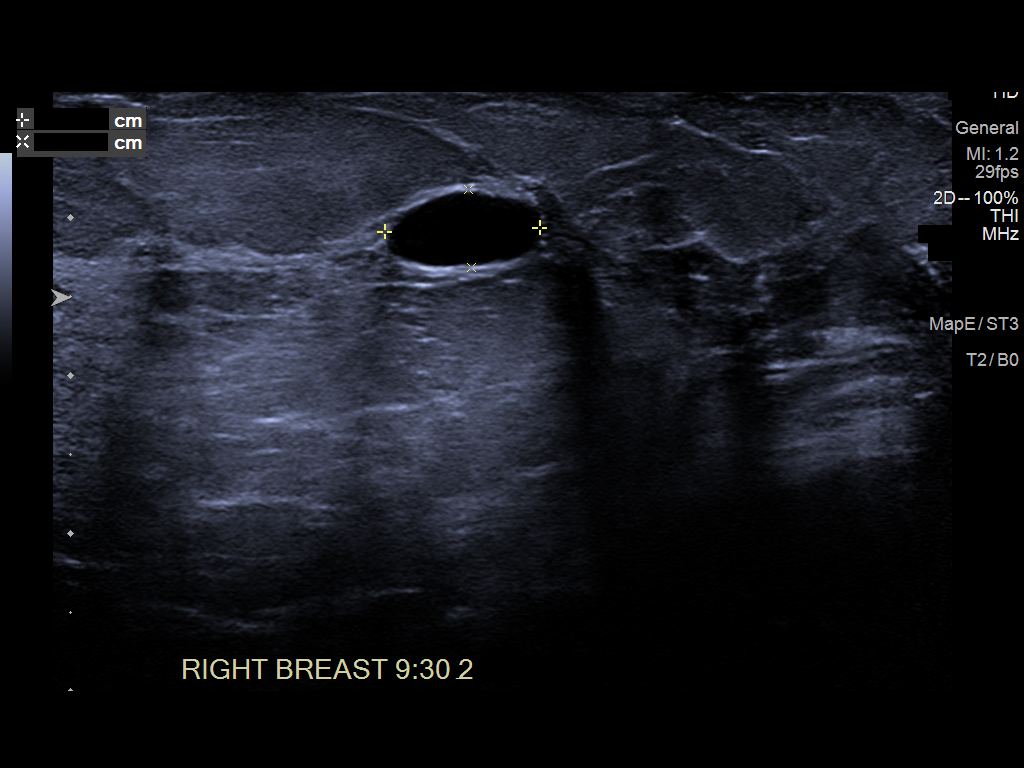
[im 3/5]
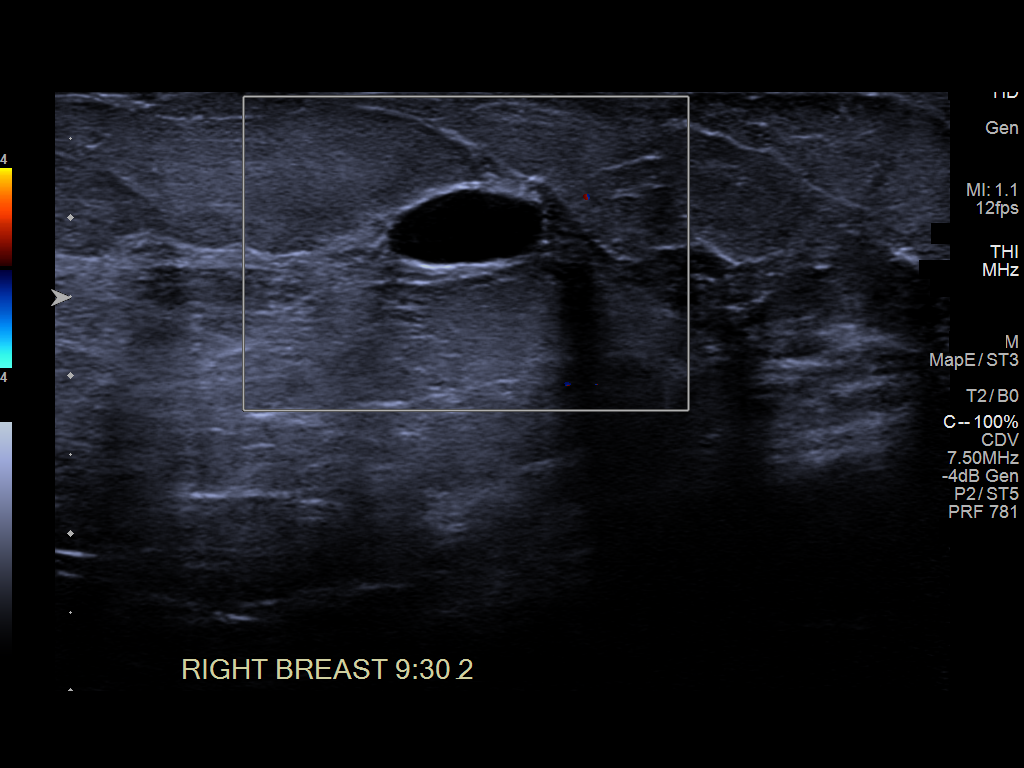
[im 4/5]
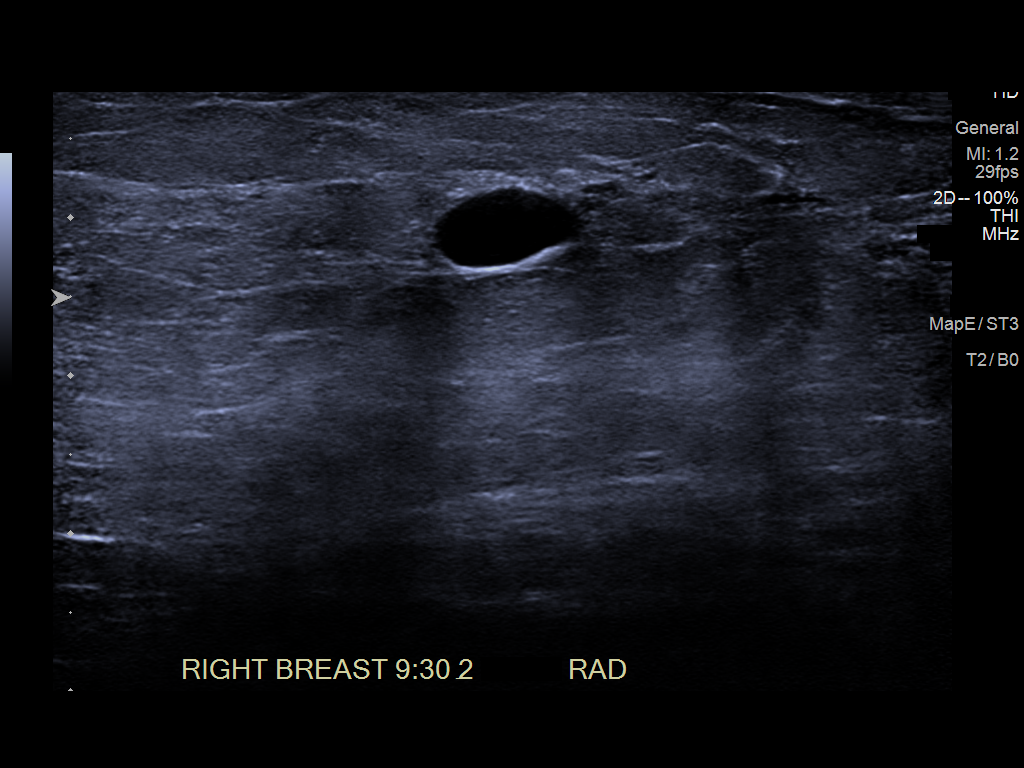
[im 5/5]
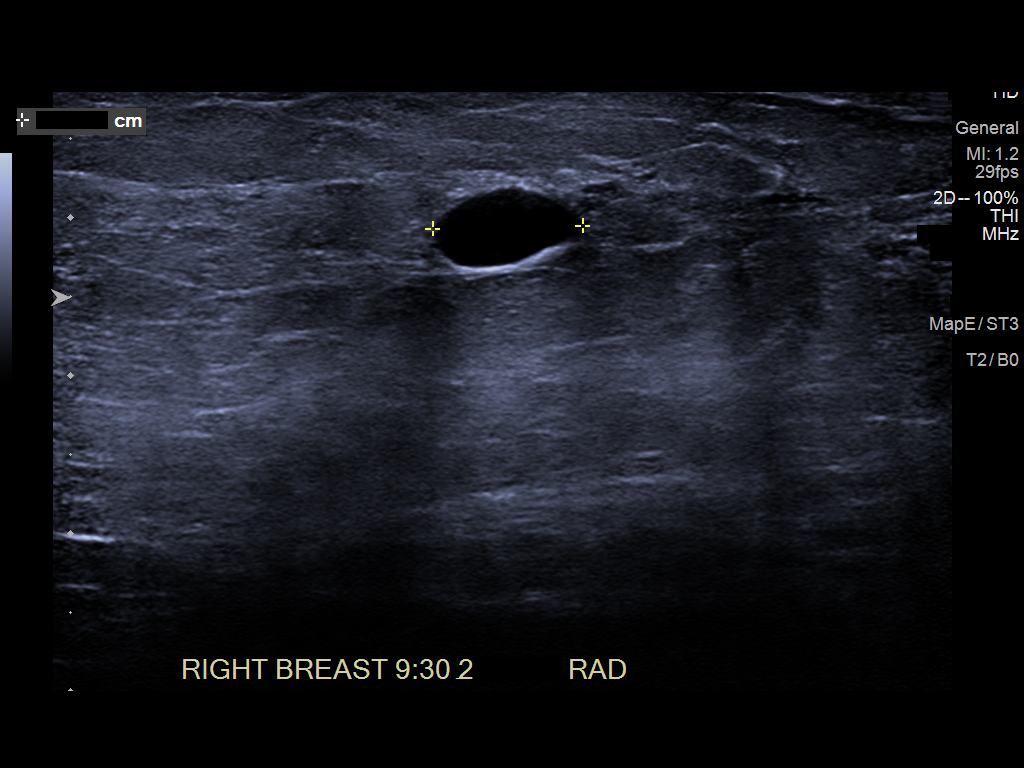

[5 of 5 positions shown; findings below may reference images not displayed]

FINDINGS: Targeted ultrasound is performed, showing a simple cyst over the
[DATE] position of the right breast 2 cm from the nipple measuring
x 1.0 x 1.0 cm. This accounts for the mammographic finding.
IMPRESSION: 1 cm simple cyst over the [DATE] position of the right breast.

RECOMMENDATION:
Recommend continued annual bilateral screening mammographic
follow-up.

I have discussed the findings and recommendations with the patient.
If applicable, a reminder letter will be sent to the patient
regarding the next appointment.

BI-RADS CATEGORY  2: Benign.

## 2019-11-11 DIAGNOSIS — H25011 Cortical age-related cataract, right eye: Secondary | ICD-10-CM | POA: Diagnosis not present

## 2019-11-11 DIAGNOSIS — H2511 Age-related nuclear cataract, right eye: Secondary | ICD-10-CM | POA: Diagnosis not present

## 2019-11-17 DIAGNOSIS — H2511 Age-related nuclear cataract, right eye: Secondary | ICD-10-CM | POA: Diagnosis not present

## 2019-11-17 DIAGNOSIS — H25811 Combined forms of age-related cataract, right eye: Secondary | ICD-10-CM | POA: Diagnosis not present

## 2019-11-17 DIAGNOSIS — H25011 Cortical age-related cataract, right eye: Secondary | ICD-10-CM | POA: Diagnosis not present

## 2019-11-24 DIAGNOSIS — H2511 Age-related nuclear cataract, right eye: Secondary | ICD-10-CM | POA: Diagnosis not present

## 2019-12-07 ENCOUNTER — Other Ambulatory Visit (INDEPENDENT_AMBULATORY_CARE_PROVIDER_SITE_OTHER): Payer: Self-pay | Admitting: Vascular Surgery

## 2019-12-07 DIAGNOSIS — N186 End stage renal disease: Secondary | ICD-10-CM

## 2019-12-08 ENCOUNTER — Ambulatory Visit (INDEPENDENT_AMBULATORY_CARE_PROVIDER_SITE_OTHER): Payer: Medicare HMO | Admitting: Vascular Surgery

## 2019-12-08 ENCOUNTER — Ambulatory Visit (INDEPENDENT_AMBULATORY_CARE_PROVIDER_SITE_OTHER): Payer: Medicare HMO

## 2019-12-08 ENCOUNTER — Encounter (INDEPENDENT_AMBULATORY_CARE_PROVIDER_SITE_OTHER): Payer: Self-pay | Admitting: Vascular Surgery

## 2019-12-08 ENCOUNTER — Other Ambulatory Visit: Payer: Self-pay

## 2019-12-08 VITALS — BP 139/78 | HR 72 | Resp 16 | Ht 64.0 in | Wt 276.0 lb

## 2019-12-08 DIAGNOSIS — I10 Essential (primary) hypertension: Secondary | ICD-10-CM

## 2019-12-08 DIAGNOSIS — N186 End stage renal disease: Secondary | ICD-10-CM | POA: Diagnosis not present

## 2019-12-08 DIAGNOSIS — N1831 Chronic kidney disease, stage 3a: Secondary | ICD-10-CM

## 2019-12-08 DIAGNOSIS — M25473 Effusion, unspecified ankle: Secondary | ICD-10-CM

## 2019-12-08 DIAGNOSIS — N2581 Secondary hyperparathyroidism of renal origin: Secondary | ICD-10-CM | POA: Insufficient documentation

## 2019-12-08 NOTE — Assessment & Plan Note (Signed)
Noninvasive studies show triphasic flow throughout the arterial system in both upper extremities.  In her left arm, both the cephalic vein and the basilic vein are marginal for fistula creation.  In the right arm, her cephalic vein is not visualized and the basilic vein does appear adequate for fistula creation.  I will contact her nephrologist.  If she does have stage IV chronic kidney disease with deterioration, consideration for left arm exploration with left brachiocephalic AV fistula creation would certainly be given.  I would prefer not to do a basilic transposition in her dominant arm well ahead of dialysis, and would recommend waiting closer to the time of dialysis and exploring her left arm with a plan to place a left brachial artery to axillary vein AV graft if fistula options not available.  I have told her we can contact her once her nephrologist feels it is time for [place] to place an access in her left arm.  We did discuss the difference between graft and fistula creation.  We did discuss the surgery today.

## 2019-12-08 NOTE — Patient Instructions (Signed)
AV Fistula Placement, Care After This sheet gives you information about how to care for yourself after your procedure. Your health care provider may also give you more specific instructions. If you have problems or questions, contact your health care provider. What can I expect after the procedure? After the procedure, it is common to:  Feel sore.  Feel a vibration (thrill) over the fistula. Follow these instructions at home: Incision care      Follow instructions from your health care provider about how to take care of your incision. Make sure you: ? Wash your hands with soap and water before and after you change your bandage (dressing). If soap and water are not available, use hand sanitizer. ? Change your dressing as told by your health care provider. ? Leave stitches (sutures), skin glue, or adhesive strips in place. These skin closures may need to stay in place for 2 weeks or longer. If adhesive strip edges start to loosen and curl up, you may trim the loose edges. Do not remove adhesive strips completely unless your health care provider tells you to do that. Fistula care  Check your fistula site every day to make sure the thrill feels the same.  Check your fistula site every day for signs of infection. Check for: ? Redness, swelling, or pain. ? Fluid or blood. ? Warmth. ? Pus or a bad smell.  Raise (elevate) the affected area above the level of your heart while you are sitting or lying down.  Do not lift anything that is heavier than 10 lb (4.5 kg), or the limit that you are told, until your health care provider says that it is safe.  Do not lie down on your fistula arm.  Do not let anyone draw blood or take a blood pressure reading on your fistula arm. This is important.  Do not wear tight jewelry or clothing over your fistula arm. Bathing  Do not take baths, swim, or use a hot tub until your health care provider approves. Ask your health care provider if you may take  showers. You may only be allowed to take sponge baths.  Keep the area around your incision clean and dry. Medicines  Take over-the-counter and prescription medicines only as told by your health care provider.  Ask your health care provider if any medicine prescribed to you can cause constipation. You may need to take steps to prevent or treat constipation, such as: ? Drink enough fluid to keep your urine pale yellow. ? Take over-the-counter or prescription medicines. ? Eat foods that are high in fiber, such as beans, whole grains, and fresh fruits and vegetables. ? Limit foods that are high in fat and processed sugars, such as fried or sweet foods. General instructions  Rest at home for a day or two.  Return to your normal activities as told by your health care provider. Ask your health care provider what activities are safe for you.  Keep all follow-up visits as told by your health care provider. This is important. Contact a health care provider if:  You have redness, swelling, or pain around your fistula site.  Your fistula site feels warm to the touch.  You have pus or a bad smell coming from your fistula site.  You have a fever.  You have numbness or coldness at your fistula site.  You feel a decrease or a change in the thrill. Get help right away if you:  Are bleeding from your fistula site.  Have chest   pain.  Have trouble breathing. Summary  Follow instructions from your health care provider about how to take care of your incision.  Do not let anyone draw blood or take a blood pressure reading on your fistula arm. This is important.  Return to your normal activities as told by your health care provider. Ask your health care provider what activities are safe for you.  Contact a health care provider if you have a change in the thrill or have any signs of infection at your fistula site.  Keep all follow-up visits as told by your health care provider. This is  important. This information is not intended to replace advice given to you by your health care provider. Make sure you discuss any questions you have with your health care provider. Document Revised: 03/06/2019 Document Reviewed: 03/24/2018 Elsevier Patient Education  2020 [employer]  

## 2019-12-08 NOTE — Assessment & Plan Note (Signed)
Likely multifactorial with renal insufficiency and obesity playing a significant role.

## 2019-12-08 NOTE — Progress Notes (Signed)
Patient ID: Breanna Lawson, female   DOB: June 08, 1946, 74 y.o.   MRN: [id number]  Chief Complaint  Patient presents with  . Follow-up    U/S Follow up    HPI Breanna Lawson is a 74 y.o. female.  I am asked to see the patient by Dr. Juleen China for evaluation of dialysis access.  The patient has longstanding chronic kidney disease with the last labs I see in our computer from 2016 with a GFR in the 30s.  She says she has had GFR in the 30s and 40s range for many years, but has never had dialysis.  She says she was told that at her last visit her renal function had actually gotten better and she was surprised she was referred over today.  I told her that I do not have access to those labs in our system, and the last note I saw from her nephrologist showed that she had stage IV chronic kidney disease.  She has no uremic symptoms that would prompt immediate dialysis.  She is not volume overloaded although she has some mild swelling.  She denies any chest pain or shortness of breath at rest.  Noninvasive studies show triphasic flow throughout the arterial system in both upper extremities.  In her left arm, both the cephalic vein and the basilic vein are marginal for fistula creation.  In the right arm, her cephalic vein is not visualized and the basilic vein does appear adequate for fistula creation.  She is right-hand dominant.     Past Medical History:  Diagnosis Date  . Arthritis   . Chronic kidney disease   . GERD (gastroesophageal reflux disease)   . Hypertension     Past Surgical History:  Procedure Laterality Date  . ANKLE SURGERY Left   . CHOLECYSTECTOMY    . COLONOSCOPY     more than 10 years ago.   Marland Kitchen DILATION AND CURETTAGE OF UTERUS    . EYE SURGERY    . INSERTION OF MESH N/A 03/25/2015   Procedure: INSERTION OF MESH;  Surgeon: Christene Lye, MD;  Location: [facility] ORS;  Service: General;  Laterality: N/A;  . VENTRAL HERNIA REPAIR N/A 03/25/2015   Procedure: HERNIA REPAIR  VENTRAL ADULT;  Surgeon: Christene Lye, MD;  Location: [facility] ORS;  Service: General;  Laterality: N/A;     Family History  Problem Relation Age of Onset  . Heart attack Mother   . Heart failure Father   . Breast cancer Sister   No bleeding or clotting disorders   Social History   Tobacco Use  . Smoking status: Never Smoker  . Smokeless tobacco: Never Used  Substance Use Topics  . Alcohol use: No    Alcohol/week: 0.0 standard drinks  . Drug use: No     No Known Allergies  Current Outpatient Medications  Medication Sig Dispense Refill  . amLODipine (NORVASC) 5 MG tablet Take by mouth.    Marland Kitchen aspirin 81 MG tablet Take 81 mg by mouth daily.    . calcium-vitamin D 250-100 MG-UNIT per tablet Take 1 tablet by mouth daily.     . furosemide (LASIX) 20 MG tablet Take by mouth.    . losartan (COZAAR) 100 MG tablet     . pantoprazole (PROTONIX) 40 MG tablet TAKE 1 TABLET BY MOUTH ONCE DAILY    . albuterol (PROVENTIL HFA) 108 (90 Base) MCG/ACT inhaler Inhale into the lungs.    Marland Kitchen allopurinol (ZYLOPRIM) 100 MG tablet     .  colchicine 0.6 MG tablet Take by mouth.    . indomethacin (INDOCIN) 25 MG capsule Take by mouth.    Marland Kitchen lisinopril-hydrochlorothiazide (PRINZIDE,ZESTORETIC) 20-25 MG per tablet Take 1 tablet by mouth daily.    . Multiple Vitamin (MULTI-VITAMINS) TABS Take by mouth.    . predniSONE (DELTASONE) 20 MG tablet     . ranitidine (ZANTAC) 300 MG tablet Take 300 mg by mouth daily.     . traMADol (ULTRAM) 50 MG tablet      No current facility-administered medications for this visit.      REVIEW OF SYSTEMS (Negative unless checked)  Constitutional: [] Weight loss  [] Fever  [] Chills Cardiac: [] Chest pain   [] Chest pressure   [] Palpitations   [] Shortness of breath when laying flat   [] Shortness of breath at rest   [] Shortness of breath with exertion. Vascular:  [] Pain in legs with walking   [] Pain in legs at rest   [] Pain in legs when laying flat   [] Claudication    [] Pain in feet when walking  [] Pain in feet at rest  [] Pain in feet when laying flat   [] History of DVT   [] Phlebitis   [] Swelling in legs   [] Varicose veins   [] Non-healing ulcers Pulmonary:   [] Uses home oxygen   [] Productive cough   [] Hemoptysis   [] Wheeze  [] COPD   [] Asthma Neurologic:  [] Dizziness  [] Blackouts   [] Seizures   [] History of stroke   [] History of TIA  [] Aphasia   [] Temporary blindness   [] Dysphagia   [] Weakness or numbness in arms   [] Weakness or numbness in legs Musculoskeletal:  [x] Arthritis   [] Joint swelling   [] Joint pain   [] Low back pain Hematologic:  [] Easy bruising  [] Easy bleeding   [] Hypercoagulable state   [] Anemic  [] Hepatitis Gastrointestinal:  [] Blood in stool   [] Vomiting blood  [x] Gastroesophageal reflux/heartburn   [] Abdominal pain Genitourinary:  [x] Chronic kidney disease   [] Difficult urination  [] Frequent urination  [] Burning with urination   [] Hematuria Skin:  [] Rashes   [] Ulcers   [] Wounds Psychological:  [] History of anxiety   []  History of major depression.    Physical Exam BP 139/78   Pulse 72   Resp 16   Ht 5\' 4"  (1.626 m)   Wt 276 lb (125.2 kg)   BMI 47.38 kg/m  Gen:  WD/WN, NAD Head: [place]/AT, No temporalis wasting. Ear/Nose/Throat: Hearing grossly intact, nares w/o erythema or drainage, oropharynx w/o Erythema/Exudate Eyes: Conjunctiva clear, sclera non-icteric.  Disconjugate gaze Neck: trachea midline.  No JVD.  Pulmonary:  Good air movement, respirations not labored, no use of accessory muscles  Cardiac: RRR, no JVD Vascular: Allen's test normal bilaterally Vessel Right Left  Radial Palpable Palpable                                    Musculoskeletal: M/S 5/5 throughout.  Extremities without ischemic changes.  No deformity or atrophy.  Mild lower extremity edema. Neurologic: Sensation grossly intact in extremities.  Symmetrical.  Speech is fluent. Motor exam as listed above. Psychiatric: Judgment intact, Mood & affect  appropriate for pt's clinical situation. Dermatologic: No rashes or ulcers noted.  No cellulitis or open wounds.    Radiology No results found.  Labs No results found for this or any previous visit (from the past 2160 hour(s)).  Assessment/Plan:  Hypertension Likely an underlying cause of her renal insufficiency and blood pressure control important in reducing the  progression of atherosclerotic disease. On appropriate oral medications.   Ankle edema Likely multifactorial with renal insufficiency and obesity playing a significant role.  Stage 3a chronic kidney disease Noninvasive studies show triphasic flow throughout the arterial system in both upper extremities.  In her left arm, both the cephalic vein and the basilic vein are marginal for fistula creation.  In the right arm, her cephalic vein is not visualized and the basilic vein does appear adequate for fistula creation.  I will contact her nephrologist.  If she does have stage IV chronic kidney disease with deterioration, consideration for left arm exploration with left brachiocephalic AV fistula creation would certainly be given.  I would prefer not to do a basilic transposition in her dominant arm well ahead of dialysis, and would recommend waiting closer to the time of dialysis and exploring her left arm with a plan to place a left brachial artery to axillary vein AV graft if fistula options not available.  I have told her we can contact her once her nephrologist feels it is time for [place] to place an access in her left arm.  We did discuss the difference between graft and fistula creation.  We did discuss the surgery today.      Leotis Pain 12/08/2019, 3:33 PM   This note was created with Dragon medical transcription system.  Any errors from dictation are unintentional.

## 2019-12-08 NOTE — Assessment & Plan Note (Signed)
Likely an underlying cause of her renal insufficiency and blood pressure control important in reducing the progression of atherosclerotic disease. On appropriate oral medications.

## 2019-12-24 ENCOUNTER — Other Ambulatory Visit (INDEPENDENT_AMBULATORY_CARE_PROVIDER_SITE_OTHER): Payer: Self-pay | Admitting: Vascular Surgery

## 2019-12-24 DIAGNOSIS — M79604 Pain in right leg: Secondary | ICD-10-CM

## 2019-12-25 ENCOUNTER — Ambulatory Visit (INDEPENDENT_AMBULATORY_CARE_PROVIDER_SITE_OTHER): Payer: Medicare HMO

## 2019-12-25 ENCOUNTER — Encounter (INDEPENDENT_AMBULATORY_CARE_PROVIDER_SITE_OTHER): Payer: Self-pay | Admitting: Nurse Practitioner

## 2019-12-25 ENCOUNTER — Encounter (INDEPENDENT_AMBULATORY_CARE_PROVIDER_SITE_OTHER): Payer: Medicare HMO

## 2019-12-25 ENCOUNTER — Other Ambulatory Visit: Payer: Self-pay

## 2019-12-25 ENCOUNTER — Ambulatory Visit (INDEPENDENT_AMBULATORY_CARE_PROVIDER_SITE_OTHER): Payer: Medicare HMO | Admitting: Nurse Practitioner

## 2019-12-25 VITALS — BP 143/77 | HR 78 | Resp 14 | Ht 63.0 in | Wt 275.0 lb

## 2019-12-25 DIAGNOSIS — N1831 Chronic kidney disease, stage 3a: Secondary | ICD-10-CM | POA: Diagnosis not present

## 2019-12-25 DIAGNOSIS — I10 Essential (primary) hypertension: Secondary | ICD-10-CM | POA: Diagnosis not present

## 2019-12-25 DIAGNOSIS — I739 Peripheral vascular disease, unspecified: Secondary | ICD-10-CM | POA: Diagnosis not present

## 2019-12-25 DIAGNOSIS — M79605 Pain in left leg: Secondary | ICD-10-CM | POA: Diagnosis not present

## 2019-12-25 DIAGNOSIS — M79604 Pain in right leg: Secondary | ICD-10-CM

## 2019-12-28 ENCOUNTER — Encounter (INDEPENDENT_AMBULATORY_CARE_PROVIDER_SITE_OTHER): Payer: Self-pay | Admitting: Nurse Practitioner

## 2019-12-28 NOTE — Progress Notes (Signed)
Subjective:    Patient ID: Breanna Lawson, female    DOB: 1946-08-15, 74 y.o.   MRN: [id number] Chief Complaint  Patient presents with  . Follow-up    U/S Follow up    The patient is seen for evaluation of painful lower extremities. Patient notes the pain is variable and not always associated with activity.  The pain is somewhat consistent day to day occurring on most days. The patient notes the pain also occurs with standing and routinely seems worse as the day wears on. The pain has been progressive over the past several years. The patient states these symptoms are causing  a profound negative impact on quality of life and daily activities.  The patient denies rest pain or dangling of an extremity off the side of the bed during the night for relief. No open wounds or sores at this time. No history of DVT or phlebitis. No prior interventions or surgeries.  There is a  history of back problems and DJD of the lumbar and sacral spine.  There is some toe discoloration present however this discoloration happens more so when the patient dangles her feet.  Once the patient is able to elevate her feet the discoloration disappears.  The patient has no constant signs symptoms of ischemia.  Today noninvasive studies show an ABI of 1.20 on the right and 1.15 on the left.  Previous ABIs done on 10/11/2017 show a previous ABI of 1.14 on the right and 1.25 on the left.  The patient has strong triphasic tibial artery waveforms with dampened toe waveforms bilaterally.   Review of Systems  Cardiovascular: Positive for leg swelling.  Musculoskeletal: Positive for arthralgias and back pain.  All other systems reviewed and are negative.      Objective:   Physical Exam Vitals reviewed.  Constitutional:      Appearance: Normal appearance. She is obese.  HENT:     Head: Normocephalic.  Cardiovascular:     Rate and Rhythm: Normal rate and regular rhythm.     Pulses: Normal pulses.  Pulmonary:      Effort: Pulmonary effort is normal.  Musculoskeletal:     Right lower leg: Edema present.     Left lower leg: Edema present.  Skin:    Capillary Refill: Capillary refill takes less than 2 seconds.     Findings: Erythema present.  Neurological:     General: No focal deficit present.     Mental Status: She is alert and oriented to person, place, and time.  Psychiatric:        Mood and Affect: Mood normal.        Behavior: Behavior normal.        Thought Content: Thought content normal.        Judgment: Judgment normal.     BP (!) 143/77   Pulse 78   Resp 14   Ht 5\' 3"  (1.6 m)   Wt 275 lb (124.7 kg)   BMI 48.71 kg/m   Past Medical History:  Diagnosis Date  . Arthritis   . Chronic kidney disease   . GERD (gastroesophageal reflux disease)   . Hypertension     Social History   Socioeconomic History  . Marital status: Widowed    Spouse name: Not on file  . Number of children: Not on file  . Years of education: Not on file  . Highest education level: Not on file  Occupational History  . Not on file  Tobacco Use  .  Smoking status: Never Smoker  . Smokeless tobacco: Never Used  Substance and Sexual Activity  . Alcohol use: No    Alcohol/week: 0.0 standard drinks  . Drug use: No  . Sexual activity: Not Currently  Other Topics Concern  . Not on file  Social History Narrative  . Not on file   Social Determinants of Health   Financial Resource Strain:   . Difficulty of Paying Living Expenses:   Food Insecurity:   . Worried About Charity fundraiser in the Last Year:   . Arboriculturist in the Last Year:   Transportation Needs:   . Film/video editor (Medical):   Marland Kitchen Lack of Transportation (Non-Medical):   Physical Activity:   . Days of Exercise per Week:   . Minutes of Exercise per Session:   Stress:   . Feeling of Stress :   Social Connections:   . Frequency of Communication with Friends and Family:   . Frequency of Social Gatherings with Friends and  Family:   . Attends Religious Services:   . Active Member of Clubs or Organizations:   . Attends Archivist Meetings:   Marland Kitchen Marital Status:   Intimate Partner Violence:   . Fear of Current or Ex-Partner:   . Emotionally Abused:   Marland Kitchen Physically Abused:   . Sexually Abused:     Past Surgical History:  Procedure Laterality Date  . ANKLE SURGERY Left   . CHOLECYSTECTOMY    . COLONOSCOPY     more than 10 years ago.   Marland Kitchen DILATION AND CURETTAGE OF UTERUS    . EYE SURGERY    . INSERTION OF MESH N/A 03/25/2015   Procedure: INSERTION OF MESH;  Surgeon: Christene Lye, MD;  Location: [facility] ORS;  Service: General;  Laterality: N/A;  . VENTRAL HERNIA REPAIR N/A 03/25/2015   Procedure: HERNIA REPAIR VENTRAL ADULT;  Surgeon: Christene Lye, MD;  Location: [facility] OR[facility];  Service: General;  Laterality: N/A;    Family History  Problem Relation Age of Onset  . Heart attack Mother   . Heart failure Father   . Breast cancer Sister     No Known Allergies     Assessment & Plan:   1. Pain in both lower extremities Recommend:  I do not find evidence of Vascular pathology that would explain the patient's symptoms.  The patient has very mild PAD which actually may be more attributed to microvascular disease.  The patient has atypical pain symptoms for vascular disease  I do not find evidence of Vascular pathology that would explain the patient's symptoms and I suspect the patient is c/o pseudoclaudication.  Patient should have an evaluation of his LS spine which I defer to the primary service.  Noninvasive studies including venous ultrasound of the legs do not identify vascular problems  The patient should continue walking and begin a more formal exercise program. The patient should continue his antiplatelet therapy and aggressive treatment of the lipid abnormalities. The patient should begin wearing graduated compression socks 15-20 mmHg strength to control her mild  edema.  Patient will follow-up with me on a PRN basis  Further work-up of her lower extremity pain is deferred to the primary service     2. Essential hypertension Continue antihypertensive medications as already ordered, these medications have been reviewed and there are no changes at this time.   3. Stage 3a chronic kidney disease Patient recently underwent vein mapping for possible dialysis access however  at this time the patient is not needing dialysis access.  We we will have the patient follow-up if the time comes for the patient to undergo dialysis.  No medication changes   Current Outpatient Medications on File Prior to Visit  Medication Sig Dispense Refill  . allopurinol (ZYLOPRIM) 100 MG tablet     . amLODipine (NORVASC) 5 MG tablet Take by mouth.    Marland Kitchen aspirin 81 MG tablet Take 81 mg by mouth daily.    . calcium-vitamin D 250-100 MG-UNIT per tablet Take 1 tablet by mouth daily.     . furosemide (LASIX) 20 MG tablet Take by mouth.    Marland Kitchen ketorolac (ACULAR) 0.5 % ophthalmic solution     . losartan (COZAAR) 100 MG tablet     . pantoprazole (PROTONIX) 40 MG tablet TAKE 1 TABLET BY MOUTH ONCE DAILY    . albuterol (PROVENTIL HFA) 108 (90 Base) MCG/ACT inhaler Inhale into the lungs.    . colchicine 0.6 MG tablet Take by mouth.    . indomethacin (INDOCIN) 25 MG capsule Take by mouth.    Marland Kitchen lisinopril-hydrochlorothiazide (PRINZIDE,ZESTORETIC) 20-25 MG per tablet Take 1 tablet by mouth daily.    . Multiple Vitamin (MULTI-VITAMINS) TABS Take by mouth.    . predniSONE (DELTASONE) 20 MG tablet     . ranitidine (ZANTAC) 300 MG tablet Take 300 mg by mouth daily.     . traMADol (ULTRAM) 50 MG tablet      No current facility-administered medications on file prior to visit.    There are no Patient Instructions on file for this visit. Return if symptoms worsen or fail to improve.   Kris Hartmann, NP

## 2020-01-20 DIAGNOSIS — M7752 Other enthesopathy of left foot: Secondary | ICD-10-CM | POA: Diagnosis not present

## 2020-01-20 DIAGNOSIS — Z6841 Body Mass Index (BMI) 40.0 and over, adult: Secondary | ICD-10-CM | POA: Diagnosis not present

## 2020-01-20 DIAGNOSIS — M65871 Other synovitis and tenosynovitis, right ankle and foot: Secondary | ICD-10-CM | POA: Diagnosis not present

## 2020-01-30 DIAGNOSIS — L723 Sebaceous cyst: Secondary | ICD-10-CM | POA: Diagnosis not present

## 2020-02-11 DIAGNOSIS — M1000 Idiopathic gout, unspecified site: Secondary | ICD-10-CM | POA: Diagnosis not present

## 2020-02-11 DIAGNOSIS — I10 Essential (primary) hypertension: Secondary | ICD-10-CM | POA: Diagnosis not present

## 2020-02-11 DIAGNOSIS — R7303 Prediabetes: Secondary | ICD-10-CM | POA: Diagnosis not present

## 2020-02-17 DIAGNOSIS — I7300 Raynaud's syndrome without gangrene: Secondary | ICD-10-CM | POA: Diagnosis not present

## 2020-02-17 DIAGNOSIS — G609 Hereditary and idiopathic neuropathy, unspecified: Secondary | ICD-10-CM | POA: Diagnosis not present

## 2020-02-18 DIAGNOSIS — M170 Bilateral primary osteoarthritis of knee: Secondary | ICD-10-CM | POA: Diagnosis not present

## 2020-02-18 DIAGNOSIS — K219 Gastro-esophageal reflux disease without esophagitis: Secondary | ICD-10-CM | POA: Diagnosis not present

## 2020-02-18 DIAGNOSIS — Z0000 Encounter for general adult medical examination without abnormal findings: Secondary | ICD-10-CM | POA: Diagnosis not present

## 2020-02-18 DIAGNOSIS — I129 Hypertensive chronic kidney disease with stage 1 through stage 4 chronic kidney disease, or unspecified chronic kidney disease: Secondary | ICD-10-CM | POA: Diagnosis not present

## 2020-02-18 DIAGNOSIS — M109 Gout, unspecified: Secondary | ICD-10-CM | POA: Diagnosis not present

## 2020-02-18 DIAGNOSIS — R7303 Prediabetes: Secondary | ICD-10-CM | POA: Diagnosis not present

## 2020-02-18 DIAGNOSIS — H269 Unspecified cataract: Secondary | ICD-10-CM | POA: Diagnosis not present

## 2020-02-18 DIAGNOSIS — M5136 Other intervertebral disc degeneration, lumbar region: Secondary | ICD-10-CM | POA: Diagnosis not present

## 2020-02-18 DIAGNOSIS — N1831 Chronic kidney disease, stage 3a: Secondary | ICD-10-CM | POA: Diagnosis not present

## 2020-03-09 DIAGNOSIS — G609 Hereditary and idiopathic neuropathy, unspecified: Secondary | ICD-10-CM | POA: Diagnosis not present

## 2020-03-09 DIAGNOSIS — M79672 Pain in left foot: Secondary | ICD-10-CM | POA: Diagnosis not present

## 2020-03-09 DIAGNOSIS — M79671 Pain in right foot: Secondary | ICD-10-CM | POA: Diagnosis not present

## 2020-04-21 DIAGNOSIS — N1831 Chronic kidney disease, stage 3a: Secondary | ICD-10-CM | POA: Diagnosis not present

## 2020-04-21 DIAGNOSIS — M10379 Gout due to renal impairment, unspecified ankle and foot: Secondary | ICD-10-CM | POA: Diagnosis not present

## 2020-04-21 DIAGNOSIS — R8290 Unspecified abnormal findings in urine: Secondary | ICD-10-CM | POA: Diagnosis not present

## 2020-04-21 DIAGNOSIS — N2581 Secondary hyperparathyroidism of renal origin: Secondary | ICD-10-CM | POA: Diagnosis not present

## 2020-04-21 DIAGNOSIS — I129 Hypertensive chronic kidney disease with stage 1 through stage 4 chronic kidney disease, or unspecified chronic kidney disease: Secondary | ICD-10-CM | POA: Diagnosis not present

## 2020-04-27 DIAGNOSIS — M109 Gout, unspecified: Secondary | ICD-10-CM | POA: Diagnosis not present

## 2020-04-27 DIAGNOSIS — N1831 Chronic kidney disease, stage 3a: Secondary | ICD-10-CM | POA: Diagnosis not present

## 2020-04-27 DIAGNOSIS — N2581 Secondary hyperparathyroidism of renal origin: Secondary | ICD-10-CM | POA: Diagnosis not present

## 2020-04-27 DIAGNOSIS — I129 Hypertensive chronic kidney disease with stage 1 through stage 4 chronic kidney disease, or unspecified chronic kidney disease: Secondary | ICD-10-CM | POA: Diagnosis not present

## 2020-06-09 DIAGNOSIS — Z961 Presence of intraocular lens: Secondary | ICD-10-CM | POA: Diagnosis not present

## 2020-06-09 DIAGNOSIS — Z0100 Encounter for examination of eyes and vision without abnormal findings: Secondary | ICD-10-CM | POA: Diagnosis not present

## 2020-06-09 DIAGNOSIS — H35372 Puckering of macula, left eye: Secondary | ICD-10-CM | POA: Diagnosis not present

## 2020-06-09 DIAGNOSIS — H5015 Alternating exotropia: Secondary | ICD-10-CM | POA: Diagnosis not present

## 2020-06-09 DIAGNOSIS — H40013 Open angle with borderline findings, low risk, bilateral: Secondary | ICD-10-CM | POA: Diagnosis not present

## 2020-08-09 DIAGNOSIS — I10 Essential (primary) hypertension: Secondary | ICD-10-CM | POA: Diagnosis not present

## 2020-08-09 DIAGNOSIS — R7303 Prediabetes: Secondary | ICD-10-CM | POA: Diagnosis not present

## 2020-08-16 DIAGNOSIS — M5416 Radiculopathy, lumbar region: Secondary | ICD-10-CM | POA: Diagnosis not present

## 2020-08-16 DIAGNOSIS — Z1231 Encounter for screening mammogram for malignant neoplasm of breast: Secondary | ICD-10-CM | POA: Diagnosis not present

## 2020-08-16 DIAGNOSIS — N1831 Chronic kidney disease, stage 3a: Secondary | ICD-10-CM | POA: Diagnosis not present

## 2020-08-16 DIAGNOSIS — M109 Gout, unspecified: Secondary | ICD-10-CM | POA: Diagnosis not present

## 2020-08-16 DIAGNOSIS — I129 Hypertensive chronic kidney disease with stage 1 through stage 4 chronic kidney disease, or unspecified chronic kidney disease: Secondary | ICD-10-CM | POA: Diagnosis not present

## 2020-08-16 DIAGNOSIS — E782 Mixed hyperlipidemia: Secondary | ICD-10-CM | POA: Diagnosis not present

## 2020-08-16 DIAGNOSIS — R7303 Prediabetes: Secondary | ICD-10-CM | POA: Diagnosis not present

## 2020-09-12 DIAGNOSIS — M545 Low back pain, unspecified: Secondary | ICD-10-CM | POA: Diagnosis not present

## 2020-09-12 DIAGNOSIS — G5763 Lesion of plantar nerve, bilateral lower limbs: Secondary | ICD-10-CM | POA: Diagnosis not present

## 2020-09-12 DIAGNOSIS — G5762 Lesion of plantar nerve, left lower limb: Secondary | ICD-10-CM | POA: Diagnosis not present

## 2020-09-12 DIAGNOSIS — M7751 Other enthesopathy of right foot: Secondary | ICD-10-CM | POA: Diagnosis not present

## 2020-09-12 DIAGNOSIS — I7300 Raynaud's syndrome without gangrene: Secondary | ICD-10-CM | POA: Diagnosis not present

## 2020-09-12 DIAGNOSIS — M216X1 Other acquired deformities of right foot: Secondary | ICD-10-CM | POA: Diagnosis not present

## 2020-09-12 DIAGNOSIS — Z6841 Body Mass Index (BMI) 40.0 and over, adult: Secondary | ICD-10-CM | POA: Diagnosis not present

## 2020-09-12 DIAGNOSIS — G609 Hereditary and idiopathic neuropathy, unspecified: Secondary | ICD-10-CM | POA: Diagnosis not present

## 2020-09-12 DIAGNOSIS — M7752 Other enthesopathy of left foot: Secondary | ICD-10-CM | POA: Diagnosis not present

## 2020-09-12 DIAGNOSIS — M79671 Pain in right foot: Secondary | ICD-10-CM | POA: Diagnosis not present

## 2020-09-12 DIAGNOSIS — G5761 Lesion of plantar nerve, right lower limb: Secondary | ICD-10-CM | POA: Diagnosis not present

## 2020-11-09 ENCOUNTER — Other Ambulatory Visit: Payer: Self-pay | Admitting: Neurology

## 2020-11-09 ENCOUNTER — Other Ambulatory Visit (HOSPITAL_COMMUNITY): Payer: Self-pay | Admitting: Neurology

## 2020-11-09 DIAGNOSIS — M7989 Other specified soft tissue disorders: Secondary | ICD-10-CM | POA: Diagnosis not present

## 2020-11-09 DIAGNOSIS — R202 Paresthesia of skin: Secondary | ICD-10-CM | POA: Diagnosis not present

## 2020-11-09 DIAGNOSIS — E538 Deficiency of other specified B group vitamins: Secondary | ICD-10-CM | POA: Diagnosis not present

## 2020-11-09 DIAGNOSIS — R200 Anesthesia of skin: Secondary | ICD-10-CM | POA: Diagnosis not present

## 2020-11-09 DIAGNOSIS — M5489 Other dorsalgia: Secondary | ICD-10-CM | POA: Diagnosis not present

## 2020-11-09 DIAGNOSIS — E559 Vitamin D deficiency, unspecified: Secondary | ICD-10-CM | POA: Diagnosis not present

## 2020-11-09 DIAGNOSIS — R208 Other disturbances of skin sensation: Secondary | ICD-10-CM | POA: Diagnosis not present

## 2020-11-11 ENCOUNTER — Other Ambulatory Visit: Payer: Self-pay

## 2020-11-11 ENCOUNTER — Ambulatory Visit
Admission: RE | Admit: 2020-11-11 | Discharge: 2020-11-11 | Disposition: A | Discharge status: Home / Self Care | Payer: Medicare HMO | Source: Ambulatory Visit | Attending: Neurology | Admitting: Neurology

## 2020-11-11 DIAGNOSIS — M79604 Pain in right leg: Secondary | ICD-10-CM | POA: Diagnosis not present

## 2020-11-11 DIAGNOSIS — M7989 Other specified soft tissue disorders: Secondary | ICD-10-CM | POA: Diagnosis not present

## 2020-11-11 DIAGNOSIS — R600 Localized edema: Secondary | ICD-10-CM | POA: Diagnosis not present

## 2020-11-29 DIAGNOSIS — N2581 Secondary hyperparathyroidism of renal origin: Secondary | ICD-10-CM | POA: Diagnosis not present

## 2020-11-29 DIAGNOSIS — Z6841 Body Mass Index (BMI) 40.0 and over, adult: Secondary | ICD-10-CM | POA: Diagnosis not present

## 2020-11-29 DIAGNOSIS — I129 Hypertensive chronic kidney disease with stage 1 through stage 4 chronic kidney disease, or unspecified chronic kidney disease: Secondary | ICD-10-CM | POA: Diagnosis not present

## 2020-11-29 DIAGNOSIS — N1831 Chronic kidney disease, stage 3a: Secondary | ICD-10-CM | POA: Diagnosis not present

## 2020-12-06 DIAGNOSIS — R200 Anesthesia of skin: Secondary | ICD-10-CM | POA: Diagnosis not present

## 2020-12-06 DIAGNOSIS — R202 Paresthesia of skin: Secondary | ICD-10-CM | POA: Diagnosis not present

## 2020-12-12 DIAGNOSIS — B351 Tinea unguium: Secondary | ICD-10-CM | POA: Diagnosis not present

## 2020-12-12 DIAGNOSIS — M79675 Pain in left toe(s): Secondary | ICD-10-CM | POA: Diagnosis not present

## 2020-12-12 DIAGNOSIS — M79674 Pain in right toe(s): Secondary | ICD-10-CM | POA: Diagnosis not present

## 2021-01-03 DIAGNOSIS — M5489 Other dorsalgia: Secondary | ICD-10-CM | POA: Diagnosis not present

## 2021-01-03 DIAGNOSIS — R200 Anesthesia of skin: Secondary | ICD-10-CM | POA: Diagnosis not present

## 2021-01-03 DIAGNOSIS — G629 Polyneuropathy, unspecified: Secondary | ICD-10-CM | POA: Diagnosis not present

## 2021-01-03 DIAGNOSIS — R202 Paresthesia of skin: Secondary | ICD-10-CM | POA: Diagnosis not present

## 2021-01-27 DIAGNOSIS — J40 Bronchitis, not specified as acute or chronic: Secondary | ICD-10-CM | POA: Diagnosis not present

## 2021-01-27 DIAGNOSIS — J9811 Atelectasis: Secondary | ICD-10-CM | POA: Diagnosis not present

## 2021-01-27 DIAGNOSIS — I517 Cardiomegaly: Secondary | ICD-10-CM | POA: Diagnosis not present

## 2021-01-27 DIAGNOSIS — U071 COVID-19: Secondary | ICD-10-CM | POA: Diagnosis not present

## 2021-02-14 DIAGNOSIS — I10 Essential (primary) hypertension: Secondary | ICD-10-CM | POA: Diagnosis not present

## 2021-02-14 DIAGNOSIS — M7989 Other specified soft tissue disorders: Secondary | ICD-10-CM | POA: Diagnosis not present

## 2021-02-14 DIAGNOSIS — N1831 Chronic kidney disease, stage 3a: Secondary | ICD-10-CM | POA: Diagnosis not present

## 2021-02-14 DIAGNOSIS — M109 Gout, unspecified: Secondary | ICD-10-CM | POA: Diagnosis not present

## 2021-02-14 DIAGNOSIS — R7303 Prediabetes: Secondary | ICD-10-CM | POA: Diagnosis not present

## 2021-02-14 DIAGNOSIS — R208 Other disturbances of skin sensation: Secondary | ICD-10-CM | POA: Diagnosis not present

## 2021-02-14 DIAGNOSIS — R200 Anesthesia of skin: Secondary | ICD-10-CM | POA: Diagnosis not present

## 2021-02-14 DIAGNOSIS — E559 Vitamin D deficiency, unspecified: Secondary | ICD-10-CM | POA: Diagnosis not present

## 2021-02-14 DIAGNOSIS — R202 Paresthesia of skin: Secondary | ICD-10-CM | POA: Diagnosis not present

## 2021-02-14 DIAGNOSIS — E782 Mixed hyperlipidemia: Secondary | ICD-10-CM | POA: Diagnosis not present

## 2021-02-14 DIAGNOSIS — M5489 Other dorsalgia: Secondary | ICD-10-CM | POA: Diagnosis not present

## 2021-02-14 DIAGNOSIS — E538 Deficiency of other specified B group vitamins: Secondary | ICD-10-CM | POA: Diagnosis not present

## 2021-02-21 ENCOUNTER — Other Ambulatory Visit: Payer: Self-pay | Admitting: Physician Assistant

## 2021-02-21 DIAGNOSIS — Z1231 Encounter for screening mammogram for malignant neoplasm of breast: Secondary | ICD-10-CM

## 2021-02-21 DIAGNOSIS — M109 Gout, unspecified: Secondary | ICD-10-CM | POA: Diagnosis not present

## 2021-02-21 DIAGNOSIS — N1831 Chronic kidney disease, stage 3a: Secondary | ICD-10-CM | POA: Diagnosis not present

## 2021-02-21 DIAGNOSIS — E782 Mixed hyperlipidemia: Secondary | ICD-10-CM | POA: Diagnosis not present

## 2021-02-21 DIAGNOSIS — M5416 Radiculopathy, lumbar region: Secondary | ICD-10-CM | POA: Diagnosis not present

## 2021-02-21 DIAGNOSIS — I129 Hypertensive chronic kidney disease with stage 1 through stage 4 chronic kidney disease, or unspecified chronic kidney disease: Secondary | ICD-10-CM | POA: Diagnosis not present

## 2021-02-21 DIAGNOSIS — E538 Deficiency of other specified B group vitamins: Secondary | ICD-10-CM | POA: Diagnosis not present

## 2021-02-21 DIAGNOSIS — R7303 Prediabetes: Secondary | ICD-10-CM | POA: Diagnosis not present

## 2021-02-21 DIAGNOSIS — R7989 Other specified abnormal findings of blood chemistry: Secondary | ICD-10-CM | POA: Diagnosis not present

## 2021-02-21 DIAGNOSIS — Z0000 Encounter for general adult medical examination without abnormal findings: Secondary | ICD-10-CM | POA: Diagnosis not present

## 2021-03-17 DIAGNOSIS — M79674 Pain in right toe(s): Secondary | ICD-10-CM | POA: Diagnosis not present

## 2021-03-17 DIAGNOSIS — M79675 Pain in left toe(s): Secondary | ICD-10-CM | POA: Diagnosis not present

## 2021-03-17 DIAGNOSIS — B351 Tinea unguium: Secondary | ICD-10-CM | POA: Diagnosis not present

## 2021-04-10 DIAGNOSIS — M7989 Other specified soft tissue disorders: Secondary | ICD-10-CM | POA: Diagnosis not present

## 2021-04-10 DIAGNOSIS — E538 Deficiency of other specified B group vitamins: Secondary | ICD-10-CM | POA: Diagnosis not present

## 2021-04-10 DIAGNOSIS — M5489 Other dorsalgia: Secondary | ICD-10-CM | POA: Diagnosis not present

## 2021-04-10 DIAGNOSIS — M79671 Pain in right foot: Secondary | ICD-10-CM | POA: Diagnosis not present

## 2021-04-10 DIAGNOSIS — R202 Paresthesia of skin: Secondary | ICD-10-CM | POA: Diagnosis not present

## 2021-04-10 DIAGNOSIS — R200 Anesthesia of skin: Secondary | ICD-10-CM | POA: Diagnosis not present

## 2021-04-10 DIAGNOSIS — M79672 Pain in left foot: Secondary | ICD-10-CM | POA: Diagnosis not present

## 2021-04-10 DIAGNOSIS — G629 Polyneuropathy, unspecified: Secondary | ICD-10-CM | POA: Diagnosis not present

## 2021-05-25 DIAGNOSIS — M170 Bilateral primary osteoarthritis of knee: Secondary | ICD-10-CM | POA: Diagnosis not present

## 2021-05-25 DIAGNOSIS — M1712 Unilateral primary osteoarthritis, left knee: Secondary | ICD-10-CM | POA: Diagnosis not present

## 2021-05-25 DIAGNOSIS — G8929 Other chronic pain: Secondary | ICD-10-CM | POA: Diagnosis not present

## 2021-05-25 DIAGNOSIS — M25562 Pain in left knee: Secondary | ICD-10-CM | POA: Diagnosis not present

## 2021-05-25 DIAGNOSIS — M25561 Pain in right knee: Secondary | ICD-10-CM | POA: Diagnosis not present

## 2021-05-25 DIAGNOSIS — M1711 Unilateral primary osteoarthritis, right knee: Secondary | ICD-10-CM | POA: Diagnosis not present

## 2021-05-30 DIAGNOSIS — M1712 Unilateral primary osteoarthritis, left knee: Secondary | ICD-10-CM | POA: Diagnosis not present

## 2021-05-30 DIAGNOSIS — M25561 Pain in right knee: Secondary | ICD-10-CM | POA: Diagnosis not present

## 2021-05-30 DIAGNOSIS — M1711 Unilateral primary osteoarthritis, right knee: Secondary | ICD-10-CM | POA: Diagnosis not present

## 2021-05-30 DIAGNOSIS — M25562 Pain in left knee: Secondary | ICD-10-CM | POA: Diagnosis not present

## 2021-05-30 DIAGNOSIS — G8929 Other chronic pain: Secondary | ICD-10-CM | POA: Diagnosis not present

## 2021-05-30 DIAGNOSIS — M170 Bilateral primary osteoarthritis of knee: Secondary | ICD-10-CM | POA: Diagnosis not present

## 2021-06-07 DIAGNOSIS — N1831 Chronic kidney disease, stage 3a: Secondary | ICD-10-CM | POA: Diagnosis not present

## 2021-06-07 DIAGNOSIS — I129 Hypertensive chronic kidney disease with stage 1 through stage 4 chronic kidney disease, or unspecified chronic kidney disease: Secondary | ICD-10-CM | POA: Diagnosis not present

## 2021-06-07 DIAGNOSIS — N2581 Secondary hyperparathyroidism of renal origin: Secondary | ICD-10-CM | POA: Diagnosis not present

## 2021-07-11 DIAGNOSIS — M79671 Pain in right foot: Secondary | ICD-10-CM | POA: Diagnosis not present

## 2021-07-11 DIAGNOSIS — M5489 Other dorsalgia: Secondary | ICD-10-CM | POA: Diagnosis not present

## 2021-07-11 DIAGNOSIS — G629 Polyneuropathy, unspecified: Secondary | ICD-10-CM | POA: Diagnosis not present

## 2021-07-11 DIAGNOSIS — R200 Anesthesia of skin: Secondary | ICD-10-CM | POA: Diagnosis not present

## 2021-07-11 DIAGNOSIS — M79672 Pain in left foot: Secondary | ICD-10-CM | POA: Diagnosis not present

## 2021-07-11 DIAGNOSIS — R202 Paresthesia of skin: Secondary | ICD-10-CM | POA: Diagnosis not present

## 2021-07-11 DIAGNOSIS — M7989 Other specified soft tissue disorders: Secondary | ICD-10-CM | POA: Diagnosis not present

## 2021-07-26 DIAGNOSIS — B351 Tinea unguium: Secondary | ICD-10-CM | POA: Diagnosis not present

## 2021-07-26 DIAGNOSIS — G609 Hereditary and idiopathic neuropathy, unspecified: Secondary | ICD-10-CM | POA: Diagnosis not present

## 2021-08-17 DIAGNOSIS — N1831 Chronic kidney disease, stage 3a: Secondary | ICD-10-CM | POA: Diagnosis not present

## 2021-08-17 DIAGNOSIS — R946 Abnormal results of thyroid function studies: Secondary | ICD-10-CM | POA: Diagnosis not present

## 2021-08-17 DIAGNOSIS — E538 Deficiency of other specified B group vitamins: Secondary | ICD-10-CM | POA: Diagnosis not present

## 2021-08-17 DIAGNOSIS — R7303 Prediabetes: Secondary | ICD-10-CM | POA: Diagnosis not present

## 2021-08-17 DIAGNOSIS — E782 Mixed hyperlipidemia: Secondary | ICD-10-CM | POA: Diagnosis not present

## 2021-08-17 DIAGNOSIS — I10 Essential (primary) hypertension: Secondary | ICD-10-CM | POA: Diagnosis not present

## 2021-08-29 DIAGNOSIS — M109 Gout, unspecified: Secondary | ICD-10-CM | POA: Diagnosis not present

## 2021-08-29 DIAGNOSIS — E538 Deficiency of other specified B group vitamins: Secondary | ICD-10-CM | POA: Diagnosis not present

## 2021-08-29 DIAGNOSIS — N1831 Chronic kidney disease, stage 3a: Secondary | ICD-10-CM | POA: Diagnosis not present

## 2021-08-29 DIAGNOSIS — Z6841 Body Mass Index (BMI) 40.0 and over, adult: Secondary | ICD-10-CM | POA: Diagnosis not present

## 2021-08-29 DIAGNOSIS — R7303 Prediabetes: Secondary | ICD-10-CM | POA: Diagnosis not present

## 2021-08-29 DIAGNOSIS — I129 Hypertensive chronic kidney disease with stage 1 through stage 4 chronic kidney disease, or unspecified chronic kidney disease: Secondary | ICD-10-CM | POA: Diagnosis not present

## 2021-08-29 DIAGNOSIS — E782 Mixed hyperlipidemia: Secondary | ICD-10-CM | POA: Diagnosis not present

## 2021-08-29 DIAGNOSIS — Z1231 Encounter for screening mammogram for malignant neoplasm of breast: Secondary | ICD-10-CM | POA: Diagnosis not present

## 2021-08-29 DIAGNOSIS — R609 Edema, unspecified: Secondary | ICD-10-CM | POA: Diagnosis not present

## 2021-08-30 DIAGNOSIS — M25462 Effusion, left knee: Secondary | ICD-10-CM | POA: Diagnosis not present

## 2021-08-30 DIAGNOSIS — M170 Bilateral primary osteoarthritis of knee: Secondary | ICD-10-CM | POA: Diagnosis not present

## 2021-08-30 DIAGNOSIS — M25561 Pain in right knee: Secondary | ICD-10-CM | POA: Diagnosis not present

## 2021-08-30 DIAGNOSIS — M25562 Pain in left knee: Secondary | ICD-10-CM | POA: Diagnosis not present

## 2021-08-30 DIAGNOSIS — M1712 Unilateral primary osteoarthritis, left knee: Secondary | ICD-10-CM | POA: Diagnosis not present

## 2021-08-30 DIAGNOSIS — M25461 Effusion, right knee: Secondary | ICD-10-CM | POA: Diagnosis not present

## 2021-08-30 DIAGNOSIS — G8929 Other chronic pain: Secondary | ICD-10-CM | POA: Diagnosis not present

## 2021-08-30 DIAGNOSIS — M1711 Unilateral primary osteoarthritis, right knee: Secondary | ICD-10-CM | POA: Diagnosis not present

## 2021-11-01 DIAGNOSIS — B351 Tinea unguium: Secondary | ICD-10-CM | POA: Diagnosis not present

## 2021-11-01 DIAGNOSIS — M792 Neuralgia and neuritis, unspecified: Secondary | ICD-10-CM | POA: Diagnosis not present

## 2021-11-01 DIAGNOSIS — L84 Corns and callosities: Secondary | ICD-10-CM | POA: Diagnosis not present

## 2021-11-01 DIAGNOSIS — M79675 Pain in left toe(s): Secondary | ICD-10-CM | POA: Diagnosis not present

## 2021-11-01 DIAGNOSIS — M79674 Pain in right toe(s): Secondary | ICD-10-CM | POA: Diagnosis not present

## 2021-11-07 ENCOUNTER — Ambulatory Visit
Admission: RE | Admit: 2021-11-07 | Discharge: 2021-11-07 | Disposition: A | Discharge status: Home / Self Care | Payer: Medicare HMO | Source: Ambulatory Visit | Attending: Physician Assistant | Admitting: Physician Assistant

## 2021-11-07 ENCOUNTER — Other Ambulatory Visit: Payer: Self-pay

## 2021-11-07 DIAGNOSIS — Z1231 Encounter for screening mammogram for malignant neoplasm of breast: Secondary | ICD-10-CM | POA: Insufficient documentation

## 2021-11-28 DIAGNOSIS — R202 Paresthesia of skin: Secondary | ICD-10-CM | POA: Diagnosis not present

## 2021-11-28 DIAGNOSIS — M25562 Pain in left knee: Secondary | ICD-10-CM | POA: Diagnosis not present

## 2021-11-28 DIAGNOSIS — M5489 Other dorsalgia: Secondary | ICD-10-CM | POA: Diagnosis not present

## 2021-11-28 DIAGNOSIS — G629 Polyneuropathy, unspecified: Secondary | ICD-10-CM | POA: Diagnosis not present

## 2021-11-28 DIAGNOSIS — M7989 Other specified soft tissue disorders: Secondary | ICD-10-CM | POA: Diagnosis not present

## 2021-11-28 DIAGNOSIS — M25561 Pain in right knee: Secondary | ICD-10-CM | POA: Diagnosis not present

## 2021-11-28 DIAGNOSIS — R200 Anesthesia of skin: Secondary | ICD-10-CM | POA: Diagnosis not present

## 2021-11-28 DIAGNOSIS — G8929 Other chronic pain: Secondary | ICD-10-CM | POA: Diagnosis not present

## 2022-01-23 DIAGNOSIS — G8929 Other chronic pain: Secondary | ICD-10-CM | POA: Diagnosis not present

## 2022-01-23 DIAGNOSIS — R200 Anesthesia of skin: Secondary | ICD-10-CM | POA: Diagnosis not present

## 2022-01-23 DIAGNOSIS — M5489 Other dorsalgia: Secondary | ICD-10-CM | POA: Diagnosis not present

## 2022-01-23 DIAGNOSIS — G629 Polyneuropathy, unspecified: Secondary | ICD-10-CM | POA: Diagnosis not present

## 2022-01-23 DIAGNOSIS — R202 Paresthesia of skin: Secondary | ICD-10-CM | POA: Diagnosis not present

## 2022-01-23 DIAGNOSIS — M25562 Pain in left knee: Secondary | ICD-10-CM | POA: Diagnosis not present

## 2022-01-23 DIAGNOSIS — M7989 Other specified soft tissue disorders: Secondary | ICD-10-CM | POA: Diagnosis not present

## 2022-01-23 DIAGNOSIS — M25561 Pain in right knee: Secondary | ICD-10-CM | POA: Diagnosis not present

## 2022-01-25 DIAGNOSIS — R053 Chronic cough: Secondary | ICD-10-CM | POA: Diagnosis not present

## 2022-01-25 DIAGNOSIS — J209 Acute bronchitis, unspecified: Secondary | ICD-10-CM | POA: Diagnosis not present

## 2022-01-25 DIAGNOSIS — R059 Cough, unspecified: Secondary | ICD-10-CM | POA: Diagnosis not present

## 2022-02-07 DIAGNOSIS — L851 Acquired keratosis [keratoderma] palmaris et plantaris: Secondary | ICD-10-CM | POA: Diagnosis not present

## 2022-02-07 DIAGNOSIS — G609 Hereditary and idiopathic neuropathy, unspecified: Secondary | ICD-10-CM | POA: Diagnosis not present

## 2022-02-07 DIAGNOSIS — B351 Tinea unguium: Secondary | ICD-10-CM | POA: Diagnosis not present

## 2022-02-21 DIAGNOSIS — I10 Essential (primary) hypertension: Secondary | ICD-10-CM | POA: Diagnosis not present

## 2022-02-21 DIAGNOSIS — E782 Mixed hyperlipidemia: Secondary | ICD-10-CM | POA: Diagnosis not present

## 2022-02-21 DIAGNOSIS — N1831 Chronic kidney disease, stage 3a: Secondary | ICD-10-CM | POA: Diagnosis not present

## 2022-02-21 DIAGNOSIS — R7303 Prediabetes: Secondary | ICD-10-CM | POA: Diagnosis not present

## 2022-02-21 DIAGNOSIS — E538 Deficiency of other specified B group vitamins: Secondary | ICD-10-CM | POA: Diagnosis not present

## 2022-02-28 DIAGNOSIS — I129 Hypertensive chronic kidney disease with stage 1 through stage 4 chronic kidney disease, or unspecified chronic kidney disease: Secondary | ICD-10-CM | POA: Diagnosis not present

## 2022-02-28 DIAGNOSIS — N1831 Chronic kidney disease, stage 3a: Secondary | ICD-10-CM | POA: Diagnosis not present

## 2022-02-28 DIAGNOSIS — E782 Mixed hyperlipidemia: Secondary | ICD-10-CM | POA: Diagnosis not present

## 2022-02-28 DIAGNOSIS — M109 Gout, unspecified: Secondary | ICD-10-CM | POA: Diagnosis not present

## 2022-02-28 DIAGNOSIS — Z0000 Encounter for general adult medical examination without abnormal findings: Secondary | ICD-10-CM | POA: Diagnosis not present

## 2022-02-28 DIAGNOSIS — R7303 Prediabetes: Secondary | ICD-10-CM | POA: Diagnosis not present

## 2022-02-28 DIAGNOSIS — Z6841 Body Mass Index (BMI) 40.0 and over, adult: Secondary | ICD-10-CM | POA: Diagnosis not present

## 2022-04-06 DIAGNOSIS — M170 Bilateral primary osteoarthritis of knee: Secondary | ICD-10-CM | POA: Diagnosis not present

## 2022-04-06 DIAGNOSIS — M25461 Effusion, right knee: Secondary | ICD-10-CM | POA: Diagnosis not present

## 2022-04-06 DIAGNOSIS — G8929 Other chronic pain: Secondary | ICD-10-CM | POA: Diagnosis not present

## 2022-04-06 DIAGNOSIS — M25462 Effusion, left knee: Secondary | ICD-10-CM | POA: Diagnosis not present

## 2022-04-13 DIAGNOSIS — J9811 Atelectasis: Secondary | ICD-10-CM | POA: Diagnosis not present

## 2022-04-13 DIAGNOSIS — M79641 Pain in right hand: Secondary | ICD-10-CM | POA: Diagnosis not present

## 2022-04-13 DIAGNOSIS — M25571 Pain in right ankle and joints of right foot: Secondary | ICD-10-CM | POA: Diagnosis not present

## 2022-04-13 DIAGNOSIS — R0789 Other chest pain: Secondary | ICD-10-CM | POA: Diagnosis not present

## 2022-04-13 DIAGNOSIS — W010XXA Fall on same level from slipping, tripping and stumbling without subsequent striking against object, initial encounter: Secondary | ICD-10-CM | POA: Diagnosis not present

## 2022-04-13 DIAGNOSIS — M546 Pain in thoracic spine: Secondary | ICD-10-CM | POA: Diagnosis not present

## 2022-04-13 DIAGNOSIS — M25561 Pain in right knee: Secondary | ICD-10-CM | POA: Diagnosis not present

## 2022-04-13 DIAGNOSIS — M8448XA Pathological fracture, other site, initial encounter for fracture: Secondary | ICD-10-CM | POA: Diagnosis not present

## 2022-04-18 DIAGNOSIS — M25561 Pain in right knee: Secondary | ICD-10-CM | POA: Diagnosis not present

## 2022-04-18 DIAGNOSIS — M5134 Other intervertebral disc degeneration, thoracic region: Secondary | ICD-10-CM | POA: Diagnosis not present

## 2022-04-18 DIAGNOSIS — S8001XA Contusion of right knee, initial encounter: Secondary | ICD-10-CM | POA: Diagnosis not present

## 2022-04-18 DIAGNOSIS — M1711 Unilateral primary osteoarthritis, right knee: Secondary | ICD-10-CM | POA: Diagnosis not present

## 2022-04-18 DIAGNOSIS — S2231XA Fracture of one rib, right side, initial encounter for closed fracture: Secondary | ICD-10-CM | POA: Diagnosis not present

## 2022-04-18 DIAGNOSIS — S60221A Contusion of right hand, initial encounter: Secondary | ICD-10-CM | POA: Diagnosis not present

## 2022-04-18 DIAGNOSIS — M25541 Pain in joints of right hand: Secondary | ICD-10-CM | POA: Diagnosis not present

## 2022-04-18 DIAGNOSIS — W010XXA Fall on same level from slipping, tripping and stumbling without subsequent striking against object, initial encounter: Secondary | ICD-10-CM | POA: Diagnosis not present

## 2022-04-18 DIAGNOSIS — M25461 Effusion, right knee: Secondary | ICD-10-CM | POA: Diagnosis not present

## 2022-05-16 DIAGNOSIS — L851 Acquired keratosis [keratoderma] palmaris et plantaris: Secondary | ICD-10-CM | POA: Diagnosis not present

## 2022-05-16 DIAGNOSIS — G609 Hereditary and idiopathic neuropathy, unspecified: Secondary | ICD-10-CM | POA: Diagnosis not present

## 2022-05-16 DIAGNOSIS — B351 Tinea unguium: Secondary | ICD-10-CM | POA: Diagnosis not present

## 2022-05-22 DIAGNOSIS — M25561 Pain in right knee: Secondary | ICD-10-CM | POA: Diagnosis not present

## 2022-05-22 DIAGNOSIS — S8001XD Contusion of right knee, subsequent encounter: Secondary | ICD-10-CM | POA: Diagnosis not present

## 2022-05-22 DIAGNOSIS — M1711 Unilateral primary osteoarthritis, right knee: Secondary | ICD-10-CM | POA: Diagnosis not present

## 2022-05-22 DIAGNOSIS — S8011XD Contusion of right lower leg, subsequent encounter: Secondary | ICD-10-CM | POA: Diagnosis not present

## 2022-05-22 DIAGNOSIS — W19XXXD Unspecified fall, subsequent encounter: Secondary | ICD-10-CM | POA: Diagnosis not present

## 2022-05-30 DIAGNOSIS — G629 Polyneuropathy, unspecified: Secondary | ICD-10-CM | POA: Diagnosis not present

## 2022-05-30 DIAGNOSIS — M7989 Other specified soft tissue disorders: Secondary | ICD-10-CM | POA: Diagnosis not present

## 2022-05-30 DIAGNOSIS — R202 Paresthesia of skin: Secondary | ICD-10-CM | POA: Diagnosis not present

## 2022-05-30 DIAGNOSIS — E538 Deficiency of other specified B group vitamins: Secondary | ICD-10-CM | POA: Diagnosis not present

## 2022-05-30 DIAGNOSIS — Z9181 History of falling: Secondary | ICD-10-CM | POA: Diagnosis not present

## 2022-05-30 DIAGNOSIS — M5489 Other dorsalgia: Secondary | ICD-10-CM | POA: Diagnosis not present

## 2022-05-30 DIAGNOSIS — M79671 Pain in right foot: Secondary | ICD-10-CM | POA: Diagnosis not present

## 2022-05-30 DIAGNOSIS — R200 Anesthesia of skin: Secondary | ICD-10-CM | POA: Diagnosis not present

## 2022-05-30 DIAGNOSIS — M25561 Pain in right knee: Secondary | ICD-10-CM | POA: Diagnosis not present

## 2022-06-11 DIAGNOSIS — I129 Hypertensive chronic kidney disease with stage 1 through stage 4 chronic kidney disease, or unspecified chronic kidney disease: Secondary | ICD-10-CM | POA: Diagnosis not present

## 2022-06-11 DIAGNOSIS — N2581 Secondary hyperparathyroidism of renal origin: Secondary | ICD-10-CM | POA: Diagnosis not present

## 2022-06-11 DIAGNOSIS — N1832 Chronic kidney disease, stage 3b: Secondary | ICD-10-CM | POA: Diagnosis not present

## 2022-06-11 DIAGNOSIS — R600 Localized edema: Secondary | ICD-10-CM | POA: Diagnosis not present

## 2022-06-27 DIAGNOSIS — M25561 Pain in right knee: Secondary | ICD-10-CM | POA: Diagnosis not present

## 2022-06-27 DIAGNOSIS — G6289 Other specified polyneuropathies: Secondary | ICD-10-CM | POA: Diagnosis not present

## 2022-06-27 DIAGNOSIS — M545 Low back pain, unspecified: Secondary | ICD-10-CM | POA: Diagnosis not present

## 2022-06-27 DIAGNOSIS — M1990 Unspecified osteoarthritis, unspecified site: Secondary | ICD-10-CM | POA: Diagnosis not present

## 2022-06-27 DIAGNOSIS — I129 Hypertensive chronic kidney disease with stage 1 through stage 4 chronic kidney disease, or unspecified chronic kidney disease: Secondary | ICD-10-CM | POA: Diagnosis not present

## 2022-06-27 DIAGNOSIS — M109 Gout, unspecified: Secondary | ICD-10-CM | POA: Diagnosis not present

## 2022-06-27 DIAGNOSIS — M25562 Pain in left knee: Secondary | ICD-10-CM | POA: Diagnosis not present

## 2022-07-05 DIAGNOSIS — M25462 Effusion, left knee: Secondary | ICD-10-CM | POA: Diagnosis not present

## 2022-07-05 DIAGNOSIS — W010XXD Fall on same level from slipping, tripping and stumbling without subsequent striking against object, subsequent encounter: Secondary | ICD-10-CM | POA: Diagnosis not present

## 2022-07-05 DIAGNOSIS — M170 Bilateral primary osteoarthritis of knee: Secondary | ICD-10-CM | POA: Diagnosis not present

## 2022-07-05 DIAGNOSIS — S8011XD Contusion of right lower leg, subsequent encounter: Secondary | ICD-10-CM | POA: Diagnosis not present

## 2022-07-05 DIAGNOSIS — S8001XD Contusion of right knee, subsequent encounter: Secondary | ICD-10-CM | POA: Diagnosis not present

## 2022-07-05 DIAGNOSIS — G8929 Other chronic pain: Secondary | ICD-10-CM | POA: Diagnosis not present

## 2022-07-24 DIAGNOSIS — H35372 Puckering of macula, left eye: Secondary | ICD-10-CM | POA: Diagnosis not present

## 2022-07-24 DIAGNOSIS — Z961 Presence of intraocular lens: Secondary | ICD-10-CM | POA: Diagnosis not present

## 2022-07-24 DIAGNOSIS — H5015 Alternating exotropia: Secondary | ICD-10-CM | POA: Diagnosis not present

## 2022-07-24 DIAGNOSIS — H40013 Open angle with borderline findings, low risk, bilateral: Secondary | ICD-10-CM | POA: Diagnosis not present

## 2022-07-30 ENCOUNTER — Encounter (INDEPENDENT_AMBULATORY_CARE_PROVIDER_SITE_OTHER): Payer: Self-pay

## 2022-07-30 DIAGNOSIS — Z780 Asymptomatic menopausal state: Secondary | ICD-10-CM | POA: Diagnosis not present

## 2022-08-28 DIAGNOSIS — N1831 Chronic kidney disease, stage 3a: Secondary | ICD-10-CM | POA: Diagnosis not present

## 2022-08-28 DIAGNOSIS — R7303 Prediabetes: Secondary | ICD-10-CM | POA: Diagnosis not present

## 2022-08-28 DIAGNOSIS — E782 Mixed hyperlipidemia: Secondary | ICD-10-CM | POA: Diagnosis not present

## 2022-08-28 DIAGNOSIS — I10 Essential (primary) hypertension: Secondary | ICD-10-CM | POA: Diagnosis not present

## 2022-08-29 DIAGNOSIS — R200 Anesthesia of skin: Secondary | ICD-10-CM | POA: Diagnosis not present

## 2022-08-29 DIAGNOSIS — B351 Tinea unguium: Secondary | ICD-10-CM | POA: Diagnosis not present

## 2022-08-29 DIAGNOSIS — R202 Paresthesia of skin: Secondary | ICD-10-CM | POA: Diagnosis not present

## 2022-08-29 DIAGNOSIS — M5489 Other dorsalgia: Secondary | ICD-10-CM | POA: Diagnosis not present

## 2022-08-29 DIAGNOSIS — G629 Polyneuropathy, unspecified: Secondary | ICD-10-CM | POA: Diagnosis not present

## 2022-08-29 DIAGNOSIS — M25561 Pain in right knee: Secondary | ICD-10-CM | POA: Diagnosis not present

## 2022-08-29 DIAGNOSIS — M79672 Pain in left foot: Secondary | ICD-10-CM | POA: Diagnosis not present

## 2022-08-29 DIAGNOSIS — M7989 Other specified soft tissue disorders: Secondary | ICD-10-CM | POA: Diagnosis not present

## 2022-08-29 DIAGNOSIS — M545 Low back pain, unspecified: Secondary | ICD-10-CM | POA: Diagnosis not present

## 2022-08-29 DIAGNOSIS — M79675 Pain in left toe(s): Secondary | ICD-10-CM | POA: Diagnosis not present

## 2022-08-29 DIAGNOSIS — M79671 Pain in right foot: Secondary | ICD-10-CM | POA: Diagnosis not present

## 2022-08-29 DIAGNOSIS — M79674 Pain in right toe(s): Secondary | ICD-10-CM | POA: Diagnosis not present

## 2022-09-04 DIAGNOSIS — Z9181 History of falling: Secondary | ICD-10-CM | POA: Diagnosis not present

## 2022-09-04 DIAGNOSIS — I129 Hypertensive chronic kidney disease with stage 1 through stage 4 chronic kidney disease, or unspecified chronic kidney disease: Secondary | ICD-10-CM | POA: Diagnosis not present

## 2022-09-04 DIAGNOSIS — Z6841 Body Mass Index (BMI) 40.0 and over, adult: Secondary | ICD-10-CM | POA: Diagnosis not present

## 2022-09-04 DIAGNOSIS — M109 Gout, unspecified: Secondary | ICD-10-CM | POA: Diagnosis not present

## 2022-09-04 DIAGNOSIS — E782 Mixed hyperlipidemia: Secondary | ICD-10-CM | POA: Diagnosis not present

## 2022-09-04 DIAGNOSIS — R7303 Prediabetes: Secondary | ICD-10-CM | POA: Diagnosis not present

## 2022-09-04 DIAGNOSIS — N1831 Chronic kidney disease, stage 3a: Secondary | ICD-10-CM | POA: Diagnosis not present

## 2022-10-04 ENCOUNTER — Other Ambulatory Visit: Payer: Self-pay | Admitting: Sports Medicine

## 2022-10-04 DIAGNOSIS — R296 Repeated falls: Secondary | ICD-10-CM

## 2022-10-04 DIAGNOSIS — M25462 Effusion, left knee: Secondary | ICD-10-CM | POA: Diagnosis not present

## 2022-10-04 DIAGNOSIS — R29898 Other symptoms and signs involving the musculoskeletal system: Secondary | ICD-10-CM

## 2022-10-04 DIAGNOSIS — M4317 Spondylolisthesis, lumbosacral region: Secondary | ICD-10-CM | POA: Diagnosis not present

## 2022-10-04 DIAGNOSIS — M25461 Effusion, right knee: Secondary | ICD-10-CM | POA: Diagnosis not present

## 2022-10-04 DIAGNOSIS — Z6841 Body Mass Index (BMI) 40.0 and over, adult: Secondary | ICD-10-CM | POA: Diagnosis not present

## 2022-10-04 DIAGNOSIS — M170 Bilateral primary osteoarthritis of knee: Secondary | ICD-10-CM | POA: Diagnosis not present

## 2022-10-04 DIAGNOSIS — G8929 Other chronic pain: Secondary | ICD-10-CM | POA: Diagnosis not present

## 2022-10-13 ENCOUNTER — Ambulatory Visit
Admission: RE | Admit: 2022-10-13 | Discharge: 2022-10-13 | Disposition: A | Discharge status: Home / Self Care | Payer: Medicare HMO | Source: Ambulatory Visit | Attending: Sports Medicine | Admitting: Sports Medicine

## 2022-10-13 DIAGNOSIS — M4317 Spondylolisthesis, lumbosacral region: Secondary | ICD-10-CM | POA: Insufficient documentation

## 2022-10-13 DIAGNOSIS — R29898 Other symptoms and signs involving the musculoskeletal system: Secondary | ICD-10-CM | POA: Insufficient documentation

## 2022-10-13 DIAGNOSIS — M5127 Other intervertebral disc displacement, lumbosacral region: Secondary | ICD-10-CM | POA: Diagnosis not present

## 2022-10-13 DIAGNOSIS — R296 Repeated falls: Secondary | ICD-10-CM | POA: Insufficient documentation

## 2022-10-13 DIAGNOSIS — M47816 Spondylosis without myelopathy or radiculopathy, lumbar region: Secondary | ICD-10-CM | POA: Diagnosis not present

## 2022-10-25 DIAGNOSIS — E538 Deficiency of other specified B group vitamins: Secondary | ICD-10-CM | POA: Diagnosis not present

## 2022-10-25 DIAGNOSIS — R296 Repeated falls: Secondary | ICD-10-CM | POA: Diagnosis not present

## 2022-10-25 DIAGNOSIS — Z6841 Body Mass Index (BMI) 40.0 and over, adult: Secondary | ICD-10-CM | POA: Diagnosis not present

## 2022-10-25 DIAGNOSIS — R531 Weakness: Secondary | ICD-10-CM | POA: Diagnosis not present

## 2022-10-30 DIAGNOSIS — M5441 Lumbago with sciatica, right side: Secondary | ICD-10-CM | POA: Diagnosis not present

## 2022-10-30 DIAGNOSIS — M48062 Spinal stenosis, lumbar region with neurogenic claudication: Secondary | ICD-10-CM | POA: Diagnosis not present

## 2022-10-30 DIAGNOSIS — G8929 Other chronic pain: Secondary | ICD-10-CM | POA: Diagnosis not present

## 2022-10-30 DIAGNOSIS — M5442 Lumbago with sciatica, left side: Secondary | ICD-10-CM | POA: Diagnosis not present

## 2022-11-10 DIAGNOSIS — G629 Polyneuropathy, unspecified: Secondary | ICD-10-CM | POA: Diagnosis not present

## 2022-11-10 DIAGNOSIS — I129 Hypertensive chronic kidney disease with stage 1 through stage 4 chronic kidney disease, or unspecified chronic kidney disease: Secondary | ICD-10-CM | POA: Diagnosis not present

## 2022-11-10 DIAGNOSIS — M4325 Fusion of spine, thoracolumbar region: Secondary | ICD-10-CM | POA: Diagnosis not present

## 2022-11-10 DIAGNOSIS — M48061 Spinal stenosis, lumbar region without neurogenic claudication: Secondary | ICD-10-CM | POA: Diagnosis not present

## 2022-11-10 DIAGNOSIS — N189 Chronic kidney disease, unspecified: Secondary | ICD-10-CM | POA: Diagnosis not present

## 2022-11-10 DIAGNOSIS — M47816 Spondylosis without myelopathy or radiculopathy, lumbar region: Secondary | ICD-10-CM | POA: Diagnosis not present

## 2022-11-10 DIAGNOSIS — M109 Gout, unspecified: Secondary | ICD-10-CM | POA: Diagnosis not present

## 2022-11-10 DIAGNOSIS — M4317 Spondylolisthesis, lumbosacral region: Secondary | ICD-10-CM | POA: Diagnosis not present

## 2022-11-10 DIAGNOSIS — M7138 Other bursal cyst, other site: Secondary | ICD-10-CM | POA: Diagnosis not present

## 2022-11-12 DIAGNOSIS — G629 Polyneuropathy, unspecified: Secondary | ICD-10-CM | POA: Diagnosis not present

## 2022-11-12 DIAGNOSIS — N189 Chronic kidney disease, unspecified: Secondary | ICD-10-CM | POA: Diagnosis not present

## 2022-11-12 DIAGNOSIS — M7138 Other bursal cyst, other site: Secondary | ICD-10-CM | POA: Diagnosis not present

## 2022-11-12 DIAGNOSIS — M48061 Spinal stenosis, lumbar region without neurogenic claudication: Secondary | ICD-10-CM | POA: Diagnosis not present

## 2022-11-12 DIAGNOSIS — M109 Gout, unspecified: Secondary | ICD-10-CM | POA: Diagnosis not present

## 2022-11-12 DIAGNOSIS — M4325 Fusion of spine, thoracolumbar region: Secondary | ICD-10-CM | POA: Diagnosis not present

## 2022-11-12 DIAGNOSIS — M4317 Spondylolisthesis, lumbosacral region: Secondary | ICD-10-CM | POA: Diagnosis not present

## 2022-11-12 DIAGNOSIS — I129 Hypertensive chronic kidney disease with stage 1 through stage 4 chronic kidney disease, or unspecified chronic kidney disease: Secondary | ICD-10-CM | POA: Diagnosis not present

## 2022-11-12 DIAGNOSIS — M47816 Spondylosis without myelopathy or radiculopathy, lumbar region: Secondary | ICD-10-CM | POA: Diagnosis not present

## 2022-11-13 DIAGNOSIS — M7138 Other bursal cyst, other site: Secondary | ICD-10-CM | POA: Diagnosis not present

## 2022-11-13 DIAGNOSIS — I129 Hypertensive chronic kidney disease with stage 1 through stage 4 chronic kidney disease, or unspecified chronic kidney disease: Secondary | ICD-10-CM | POA: Diagnosis not present

## 2022-11-13 DIAGNOSIS — G629 Polyneuropathy, unspecified: Secondary | ICD-10-CM | POA: Diagnosis not present

## 2022-11-13 DIAGNOSIS — N189 Chronic kidney disease, unspecified: Secondary | ICD-10-CM | POA: Diagnosis not present

## 2022-11-13 DIAGNOSIS — M48061 Spinal stenosis, lumbar region without neurogenic claudication: Secondary | ICD-10-CM | POA: Diagnosis not present

## 2022-11-13 DIAGNOSIS — M4325 Fusion of spine, thoracolumbar region: Secondary | ICD-10-CM | POA: Diagnosis not present

## 2022-11-13 DIAGNOSIS — M47816 Spondylosis without myelopathy or radiculopathy, lumbar region: Secondary | ICD-10-CM | POA: Diagnosis not present

## 2022-11-13 DIAGNOSIS — M109 Gout, unspecified: Secondary | ICD-10-CM | POA: Diagnosis not present

## 2022-11-13 DIAGNOSIS — M4317 Spondylolisthesis, lumbosacral region: Secondary | ICD-10-CM | POA: Diagnosis not present

## 2022-11-15 DIAGNOSIS — I129 Hypertensive chronic kidney disease with stage 1 through stage 4 chronic kidney disease, or unspecified chronic kidney disease: Secondary | ICD-10-CM | POA: Diagnosis not present

## 2022-11-15 DIAGNOSIS — M4317 Spondylolisthesis, lumbosacral region: Secondary | ICD-10-CM | POA: Diagnosis not present

## 2022-11-15 DIAGNOSIS — M4325 Fusion of spine, thoracolumbar region: Secondary | ICD-10-CM | POA: Diagnosis not present

## 2022-11-15 DIAGNOSIS — M7138 Other bursal cyst, other site: Secondary | ICD-10-CM | POA: Diagnosis not present

## 2022-11-15 DIAGNOSIS — M47816 Spondylosis without myelopathy or radiculopathy, lumbar region: Secondary | ICD-10-CM | POA: Diagnosis not present

## 2022-11-15 DIAGNOSIS — M48061 Spinal stenosis, lumbar region without neurogenic claudication: Secondary | ICD-10-CM | POA: Diagnosis not present

## 2022-11-15 DIAGNOSIS — N189 Chronic kidney disease, unspecified: Secondary | ICD-10-CM | POA: Diagnosis not present

## 2022-11-15 DIAGNOSIS — M109 Gout, unspecified: Secondary | ICD-10-CM | POA: Diagnosis not present

## 2022-11-15 DIAGNOSIS — G629 Polyneuropathy, unspecified: Secondary | ICD-10-CM | POA: Diagnosis not present

## 2022-11-16 DIAGNOSIS — M47816 Spondylosis without myelopathy or radiculopathy, lumbar region: Secondary | ICD-10-CM | POA: Diagnosis not present

## 2022-11-16 DIAGNOSIS — M109 Gout, unspecified: Secondary | ICD-10-CM | POA: Diagnosis not present

## 2022-11-16 DIAGNOSIS — M4317 Spondylolisthesis, lumbosacral region: Secondary | ICD-10-CM | POA: Diagnosis not present

## 2022-11-16 DIAGNOSIS — M4325 Fusion of spine, thoracolumbar region: Secondary | ICD-10-CM | POA: Diagnosis not present

## 2022-11-16 DIAGNOSIS — G629 Polyneuropathy, unspecified: Secondary | ICD-10-CM | POA: Diagnosis not present

## 2022-11-16 DIAGNOSIS — N189 Chronic kidney disease, unspecified: Secondary | ICD-10-CM | POA: Diagnosis not present

## 2022-11-16 DIAGNOSIS — M7138 Other bursal cyst, other site: Secondary | ICD-10-CM | POA: Diagnosis not present

## 2022-11-16 DIAGNOSIS — M48061 Spinal stenosis, lumbar region without neurogenic claudication: Secondary | ICD-10-CM | POA: Diagnosis not present

## 2022-11-16 DIAGNOSIS — I129 Hypertensive chronic kidney disease with stage 1 through stage 4 chronic kidney disease, or unspecified chronic kidney disease: Secondary | ICD-10-CM | POA: Diagnosis not present

## 2022-11-19 DIAGNOSIS — M4325 Fusion of spine, thoracolumbar region: Secondary | ICD-10-CM | POA: Diagnosis not present

## 2022-11-19 DIAGNOSIS — G629 Polyneuropathy, unspecified: Secondary | ICD-10-CM | POA: Diagnosis not present

## 2022-11-19 DIAGNOSIS — M48061 Spinal stenosis, lumbar region without neurogenic claudication: Secondary | ICD-10-CM | POA: Diagnosis not present

## 2022-11-19 DIAGNOSIS — N189 Chronic kidney disease, unspecified: Secondary | ICD-10-CM | POA: Diagnosis not present

## 2022-11-19 DIAGNOSIS — M7138 Other bursal cyst, other site: Secondary | ICD-10-CM | POA: Diagnosis not present

## 2022-11-19 DIAGNOSIS — M47816 Spondylosis without myelopathy or radiculopathy, lumbar region: Secondary | ICD-10-CM | POA: Diagnosis not present

## 2022-11-19 DIAGNOSIS — M4317 Spondylolisthesis, lumbosacral region: Secondary | ICD-10-CM | POA: Diagnosis not present

## 2022-11-19 DIAGNOSIS — M109 Gout, unspecified: Secondary | ICD-10-CM | POA: Diagnosis not present

## 2022-11-19 DIAGNOSIS — I129 Hypertensive chronic kidney disease with stage 1 through stage 4 chronic kidney disease, or unspecified chronic kidney disease: Secondary | ICD-10-CM | POA: Diagnosis not present

## 2022-11-20 DIAGNOSIS — M4317 Spondylolisthesis, lumbosacral region: Secondary | ICD-10-CM | POA: Diagnosis not present

## 2022-11-20 DIAGNOSIS — M47816 Spondylosis without myelopathy or radiculopathy, lumbar region: Secondary | ICD-10-CM | POA: Diagnosis not present

## 2022-11-20 DIAGNOSIS — N189 Chronic kidney disease, unspecified: Secondary | ICD-10-CM | POA: Diagnosis not present

## 2022-11-20 DIAGNOSIS — M7138 Other bursal cyst, other site: Secondary | ICD-10-CM | POA: Diagnosis not present

## 2022-11-20 DIAGNOSIS — M48061 Spinal stenosis, lumbar region without neurogenic claudication: Secondary | ICD-10-CM | POA: Diagnosis not present

## 2022-11-20 DIAGNOSIS — G629 Polyneuropathy, unspecified: Secondary | ICD-10-CM | POA: Diagnosis not present

## 2022-11-20 DIAGNOSIS — I129 Hypertensive chronic kidney disease with stage 1 through stage 4 chronic kidney disease, or unspecified chronic kidney disease: Secondary | ICD-10-CM | POA: Diagnosis not present

## 2022-11-20 DIAGNOSIS — M109 Gout, unspecified: Secondary | ICD-10-CM | POA: Diagnosis not present

## 2022-11-20 DIAGNOSIS — M4325 Fusion of spine, thoracolumbar region: Secondary | ICD-10-CM | POA: Diagnosis not present

## 2022-11-21 DIAGNOSIS — I129 Hypertensive chronic kidney disease with stage 1 through stage 4 chronic kidney disease, or unspecified chronic kidney disease: Secondary | ICD-10-CM | POA: Diagnosis not present

## 2022-11-21 DIAGNOSIS — M7138 Other bursal cyst, other site: Secondary | ICD-10-CM | POA: Diagnosis not present

## 2022-11-21 DIAGNOSIS — G629 Polyneuropathy, unspecified: Secondary | ICD-10-CM | POA: Diagnosis not present

## 2022-11-21 DIAGNOSIS — M4325 Fusion of spine, thoracolumbar region: Secondary | ICD-10-CM | POA: Diagnosis not present

## 2022-11-21 DIAGNOSIS — M47816 Spondylosis without myelopathy or radiculopathy, lumbar region: Secondary | ICD-10-CM | POA: Diagnosis not present

## 2022-11-21 DIAGNOSIS — M48061 Spinal stenosis, lumbar region without neurogenic claudication: Secondary | ICD-10-CM | POA: Diagnosis not present

## 2022-11-21 DIAGNOSIS — M4317 Spondylolisthesis, lumbosacral region: Secondary | ICD-10-CM | POA: Diagnosis not present

## 2022-11-22 DIAGNOSIS — M7138 Other bursal cyst, other site: Secondary | ICD-10-CM | POA: Diagnosis not present

## 2022-11-22 DIAGNOSIS — M48061 Spinal stenosis, lumbar region without neurogenic claudication: Secondary | ICD-10-CM | POA: Diagnosis not present

## 2022-11-22 DIAGNOSIS — M4325 Fusion of spine, thoracolumbar region: Secondary | ICD-10-CM | POA: Diagnosis not present

## 2022-11-22 DIAGNOSIS — M109 Gout, unspecified: Secondary | ICD-10-CM | POA: Diagnosis not present

## 2022-11-22 DIAGNOSIS — G629 Polyneuropathy, unspecified: Secondary | ICD-10-CM | POA: Diagnosis not present

## 2022-11-22 DIAGNOSIS — M4317 Spondylolisthesis, lumbosacral region: Secondary | ICD-10-CM | POA: Diagnosis not present

## 2022-11-22 DIAGNOSIS — N189 Chronic kidney disease, unspecified: Secondary | ICD-10-CM | POA: Diagnosis not present

## 2022-11-22 DIAGNOSIS — M47816 Spondylosis without myelopathy or radiculopathy, lumbar region: Secondary | ICD-10-CM | POA: Diagnosis not present

## 2022-11-22 DIAGNOSIS — I129 Hypertensive chronic kidney disease with stage 1 through stage 4 chronic kidney disease, or unspecified chronic kidney disease: Secondary | ICD-10-CM | POA: Diagnosis not present

## 2022-11-27 DIAGNOSIS — M4317 Spondylolisthesis, lumbosacral region: Secondary | ICD-10-CM | POA: Diagnosis not present

## 2022-11-27 DIAGNOSIS — M48061 Spinal stenosis, lumbar region without neurogenic claudication: Secondary | ICD-10-CM | POA: Diagnosis not present

## 2022-11-27 DIAGNOSIS — M109 Gout, unspecified: Secondary | ICD-10-CM | POA: Diagnosis not present

## 2022-11-27 DIAGNOSIS — G629 Polyneuropathy, unspecified: Secondary | ICD-10-CM | POA: Diagnosis not present

## 2022-11-27 DIAGNOSIS — M47816 Spondylosis without myelopathy or radiculopathy, lumbar region: Secondary | ICD-10-CM | POA: Diagnosis not present

## 2022-11-27 DIAGNOSIS — M4325 Fusion of spine, thoracolumbar region: Secondary | ICD-10-CM | POA: Diagnosis not present

## 2022-11-27 DIAGNOSIS — M7138 Other bursal cyst, other site: Secondary | ICD-10-CM | POA: Diagnosis not present

## 2022-11-27 DIAGNOSIS — N189 Chronic kidney disease, unspecified: Secondary | ICD-10-CM | POA: Diagnosis not present

## 2022-11-27 DIAGNOSIS — I129 Hypertensive chronic kidney disease with stage 1 through stage 4 chronic kidney disease, or unspecified chronic kidney disease: Secondary | ICD-10-CM | POA: Diagnosis not present

## 2022-11-28 DIAGNOSIS — M47816 Spondylosis without myelopathy or radiculopathy, lumbar region: Secondary | ICD-10-CM | POA: Diagnosis not present

## 2022-11-28 DIAGNOSIS — I129 Hypertensive chronic kidney disease with stage 1 through stage 4 chronic kidney disease, or unspecified chronic kidney disease: Secondary | ICD-10-CM | POA: Diagnosis not present

## 2022-11-28 DIAGNOSIS — G629 Polyneuropathy, unspecified: Secondary | ICD-10-CM | POA: Diagnosis not present

## 2022-11-28 DIAGNOSIS — M4325 Fusion of spine, thoracolumbar region: Secondary | ICD-10-CM | POA: Diagnosis not present

## 2022-11-28 DIAGNOSIS — M4317 Spondylolisthesis, lumbosacral region: Secondary | ICD-10-CM | POA: Diagnosis not present

## 2022-11-28 DIAGNOSIS — M48061 Spinal stenosis, lumbar region without neurogenic claudication: Secondary | ICD-10-CM | POA: Diagnosis not present

## 2022-11-28 DIAGNOSIS — M7138 Other bursal cyst, other site: Secondary | ICD-10-CM | POA: Diagnosis not present

## 2022-11-29 DIAGNOSIS — M47816 Spondylosis without myelopathy or radiculopathy, lumbar region: Secondary | ICD-10-CM | POA: Diagnosis not present

## 2022-11-29 DIAGNOSIS — G629 Polyneuropathy, unspecified: Secondary | ICD-10-CM | POA: Diagnosis not present

## 2022-11-29 DIAGNOSIS — M48061 Spinal stenosis, lumbar region without neurogenic claudication: Secondary | ICD-10-CM | POA: Diagnosis not present

## 2022-11-29 DIAGNOSIS — M4317 Spondylolisthesis, lumbosacral region: Secondary | ICD-10-CM | POA: Diagnosis not present

## 2022-11-29 DIAGNOSIS — N189 Chronic kidney disease, unspecified: Secondary | ICD-10-CM | POA: Diagnosis not present

## 2022-11-29 DIAGNOSIS — M109 Gout, unspecified: Secondary | ICD-10-CM | POA: Diagnosis not present

## 2022-11-29 DIAGNOSIS — M4325 Fusion of spine, thoracolumbar region: Secondary | ICD-10-CM | POA: Diagnosis not present

## 2022-11-29 DIAGNOSIS — M7138 Other bursal cyst, other site: Secondary | ICD-10-CM | POA: Diagnosis not present

## 2022-11-29 DIAGNOSIS — I129 Hypertensive chronic kidney disease with stage 1 through stage 4 chronic kidney disease, or unspecified chronic kidney disease: Secondary | ICD-10-CM | POA: Diagnosis not present

## 2022-12-03 DIAGNOSIS — M4325 Fusion of spine, thoracolumbar region: Secondary | ICD-10-CM | POA: Diagnosis not present

## 2022-12-03 DIAGNOSIS — N189 Chronic kidney disease, unspecified: Secondary | ICD-10-CM | POA: Diagnosis not present

## 2022-12-03 DIAGNOSIS — M4317 Spondylolisthesis, lumbosacral region: Secondary | ICD-10-CM | POA: Diagnosis not present

## 2022-12-03 DIAGNOSIS — M109 Gout, unspecified: Secondary | ICD-10-CM | POA: Diagnosis not present

## 2022-12-03 DIAGNOSIS — I129 Hypertensive chronic kidney disease with stage 1 through stage 4 chronic kidney disease, or unspecified chronic kidney disease: Secondary | ICD-10-CM | POA: Diagnosis not present

## 2022-12-03 DIAGNOSIS — G629 Polyneuropathy, unspecified: Secondary | ICD-10-CM | POA: Diagnosis not present

## 2022-12-03 DIAGNOSIS — M7138 Other bursal cyst, other site: Secondary | ICD-10-CM | POA: Diagnosis not present

## 2022-12-03 DIAGNOSIS — M47816 Spondylosis without myelopathy or radiculopathy, lumbar region: Secondary | ICD-10-CM | POA: Diagnosis not present

## 2022-12-03 DIAGNOSIS — M48061 Spinal stenosis, lumbar region without neurogenic claudication: Secondary | ICD-10-CM | POA: Diagnosis not present

## 2022-12-04 DIAGNOSIS — M109 Gout, unspecified: Secondary | ICD-10-CM | POA: Diagnosis not present

## 2022-12-04 DIAGNOSIS — M4325 Fusion of spine, thoracolumbar region: Secondary | ICD-10-CM | POA: Diagnosis not present

## 2022-12-04 DIAGNOSIS — M47816 Spondylosis without myelopathy or radiculopathy, lumbar region: Secondary | ICD-10-CM | POA: Diagnosis not present

## 2022-12-04 DIAGNOSIS — I129 Hypertensive chronic kidney disease with stage 1 through stage 4 chronic kidney disease, or unspecified chronic kidney disease: Secondary | ICD-10-CM | POA: Diagnosis not present

## 2022-12-04 DIAGNOSIS — M4317 Spondylolisthesis, lumbosacral region: Secondary | ICD-10-CM | POA: Diagnosis not present

## 2022-12-04 DIAGNOSIS — G629 Polyneuropathy, unspecified: Secondary | ICD-10-CM | POA: Diagnosis not present

## 2022-12-04 DIAGNOSIS — M7138 Other bursal cyst, other site: Secondary | ICD-10-CM | POA: Diagnosis not present

## 2022-12-04 DIAGNOSIS — M48061 Spinal stenosis, lumbar region without neurogenic claudication: Secondary | ICD-10-CM | POA: Diagnosis not present

## 2022-12-04 DIAGNOSIS — N189 Chronic kidney disease, unspecified: Secondary | ICD-10-CM | POA: Diagnosis not present

## 2022-12-05 DIAGNOSIS — G629 Polyneuropathy, unspecified: Secondary | ICD-10-CM | POA: Diagnosis not present

## 2022-12-05 DIAGNOSIS — N189 Chronic kidney disease, unspecified: Secondary | ICD-10-CM | POA: Diagnosis not present

## 2022-12-05 DIAGNOSIS — M109 Gout, unspecified: Secondary | ICD-10-CM | POA: Diagnosis not present

## 2022-12-05 DIAGNOSIS — M48061 Spinal stenosis, lumbar region without neurogenic claudication: Secondary | ICD-10-CM | POA: Diagnosis not present

## 2022-12-05 DIAGNOSIS — M47816 Spondylosis without myelopathy or radiculopathy, lumbar region: Secondary | ICD-10-CM | POA: Diagnosis not present

## 2022-12-05 DIAGNOSIS — I129 Hypertensive chronic kidney disease with stage 1 through stage 4 chronic kidney disease, or unspecified chronic kidney disease: Secondary | ICD-10-CM | POA: Diagnosis not present

## 2022-12-05 DIAGNOSIS — M7138 Other bursal cyst, other site: Secondary | ICD-10-CM | POA: Diagnosis not present

## 2022-12-05 DIAGNOSIS — M4325 Fusion of spine, thoracolumbar region: Secondary | ICD-10-CM | POA: Diagnosis not present

## 2022-12-05 DIAGNOSIS — M4317 Spondylolisthesis, lumbosacral region: Secondary | ICD-10-CM | POA: Diagnosis not present

## 2022-12-06 DIAGNOSIS — M4317 Spondylolisthesis, lumbosacral region: Secondary | ICD-10-CM | POA: Diagnosis not present

## 2022-12-06 DIAGNOSIS — G629 Polyneuropathy, unspecified: Secondary | ICD-10-CM | POA: Diagnosis not present

## 2022-12-06 DIAGNOSIS — M7138 Other bursal cyst, other site: Secondary | ICD-10-CM | POA: Diagnosis not present

## 2022-12-06 DIAGNOSIS — M4325 Fusion of spine, thoracolumbar region: Secondary | ICD-10-CM | POA: Diagnosis not present

## 2022-12-06 DIAGNOSIS — I129 Hypertensive chronic kidney disease with stage 1 through stage 4 chronic kidney disease, or unspecified chronic kidney disease: Secondary | ICD-10-CM | POA: Diagnosis not present

## 2022-12-06 DIAGNOSIS — M48061 Spinal stenosis, lumbar region without neurogenic claudication: Secondary | ICD-10-CM | POA: Diagnosis not present

## 2022-12-06 DIAGNOSIS — M47816 Spondylosis without myelopathy or radiculopathy, lumbar region: Secondary | ICD-10-CM | POA: Diagnosis not present

## 2022-12-06 DIAGNOSIS — M109 Gout, unspecified: Secondary | ICD-10-CM | POA: Diagnosis not present

## 2022-12-06 DIAGNOSIS — N189 Chronic kidney disease, unspecified: Secondary | ICD-10-CM | POA: Diagnosis not present

## 2022-12-07 DIAGNOSIS — I129 Hypertensive chronic kidney disease with stage 1 through stage 4 chronic kidney disease, or unspecified chronic kidney disease: Secondary | ICD-10-CM | POA: Diagnosis not present

## 2022-12-07 DIAGNOSIS — N189 Chronic kidney disease, unspecified: Secondary | ICD-10-CM | POA: Diagnosis not present

## 2022-12-07 DIAGNOSIS — M47816 Spondylosis without myelopathy or radiculopathy, lumbar region: Secondary | ICD-10-CM | POA: Diagnosis not present

## 2022-12-07 DIAGNOSIS — M7138 Other bursal cyst, other site: Secondary | ICD-10-CM | POA: Diagnosis not present

## 2022-12-07 DIAGNOSIS — G629 Polyneuropathy, unspecified: Secondary | ICD-10-CM | POA: Diagnosis not present

## 2022-12-07 DIAGNOSIS — M4325 Fusion of spine, thoracolumbar region: Secondary | ICD-10-CM | POA: Diagnosis not present

## 2022-12-07 DIAGNOSIS — M48061 Spinal stenosis, lumbar region without neurogenic claudication: Secondary | ICD-10-CM | POA: Diagnosis not present

## 2022-12-07 DIAGNOSIS — M4317 Spondylolisthesis, lumbosacral region: Secondary | ICD-10-CM | POA: Diagnosis not present

## 2022-12-07 DIAGNOSIS — M109 Gout, unspecified: Secondary | ICD-10-CM | POA: Diagnosis not present

## 2022-12-10 DIAGNOSIS — I129 Hypertensive chronic kidney disease with stage 1 through stage 4 chronic kidney disease, or unspecified chronic kidney disease: Secondary | ICD-10-CM | POA: Diagnosis not present

## 2022-12-10 DIAGNOSIS — G629 Polyneuropathy, unspecified: Secondary | ICD-10-CM | POA: Diagnosis not present

## 2022-12-10 DIAGNOSIS — M7138 Other bursal cyst, other site: Secondary | ICD-10-CM | POA: Diagnosis not present

## 2022-12-10 DIAGNOSIS — M4325 Fusion of spine, thoracolumbar region: Secondary | ICD-10-CM | POA: Diagnosis not present

## 2022-12-10 DIAGNOSIS — M47816 Spondylosis without myelopathy or radiculopathy, lumbar region: Secondary | ICD-10-CM | POA: Diagnosis not present

## 2022-12-10 DIAGNOSIS — M48061 Spinal stenosis, lumbar region without neurogenic claudication: Secondary | ICD-10-CM | POA: Diagnosis not present

## 2022-12-10 DIAGNOSIS — N189 Chronic kidney disease, unspecified: Secondary | ICD-10-CM | POA: Diagnosis not present

## 2022-12-10 DIAGNOSIS — M4317 Spondylolisthesis, lumbosacral region: Secondary | ICD-10-CM | POA: Diagnosis not present

## 2022-12-10 DIAGNOSIS — M109 Gout, unspecified: Secondary | ICD-10-CM | POA: Diagnosis not present

## 2022-12-11 DIAGNOSIS — M7138 Other bursal cyst, other site: Secondary | ICD-10-CM | POA: Diagnosis not present

## 2022-12-11 DIAGNOSIS — G629 Polyneuropathy, unspecified: Secondary | ICD-10-CM | POA: Diagnosis not present

## 2022-12-11 DIAGNOSIS — M47816 Spondylosis without myelopathy or radiculopathy, lumbar region: Secondary | ICD-10-CM | POA: Diagnosis not present

## 2022-12-11 DIAGNOSIS — M48061 Spinal stenosis, lumbar region without neurogenic claudication: Secondary | ICD-10-CM | POA: Diagnosis not present

## 2022-12-11 DIAGNOSIS — M4325 Fusion of spine, thoracolumbar region: Secondary | ICD-10-CM | POA: Diagnosis not present

## 2022-12-11 DIAGNOSIS — M109 Gout, unspecified: Secondary | ICD-10-CM | POA: Diagnosis not present

## 2022-12-11 DIAGNOSIS — I129 Hypertensive chronic kidney disease with stage 1 through stage 4 chronic kidney disease, or unspecified chronic kidney disease: Secondary | ICD-10-CM | POA: Diagnosis not present

## 2022-12-11 DIAGNOSIS — M4317 Spondylolisthesis, lumbosacral region: Secondary | ICD-10-CM | POA: Diagnosis not present

## 2022-12-11 DIAGNOSIS — N189 Chronic kidney disease, unspecified: Secondary | ICD-10-CM | POA: Diagnosis not present

## 2022-12-13 DIAGNOSIS — M4317 Spondylolisthesis, lumbosacral region: Secondary | ICD-10-CM | POA: Diagnosis not present

## 2022-12-13 DIAGNOSIS — G629 Polyneuropathy, unspecified: Secondary | ICD-10-CM | POA: Diagnosis not present

## 2022-12-13 DIAGNOSIS — N189 Chronic kidney disease, unspecified: Secondary | ICD-10-CM | POA: Diagnosis not present

## 2022-12-13 DIAGNOSIS — M109 Gout, unspecified: Secondary | ICD-10-CM | POA: Diagnosis not present

## 2022-12-13 DIAGNOSIS — M4325 Fusion of spine, thoracolumbar region: Secondary | ICD-10-CM | POA: Diagnosis not present

## 2022-12-13 DIAGNOSIS — I129 Hypertensive chronic kidney disease with stage 1 through stage 4 chronic kidney disease, or unspecified chronic kidney disease: Secondary | ICD-10-CM | POA: Diagnosis not present

## 2022-12-13 DIAGNOSIS — M7138 Other bursal cyst, other site: Secondary | ICD-10-CM | POA: Diagnosis not present

## 2022-12-13 DIAGNOSIS — M47816 Spondylosis without myelopathy or radiculopathy, lumbar region: Secondary | ICD-10-CM | POA: Diagnosis not present

## 2022-12-13 DIAGNOSIS — M48061 Spinal stenosis, lumbar region without neurogenic claudication: Secondary | ICD-10-CM | POA: Diagnosis not present

## 2022-12-18 DIAGNOSIS — M4317 Spondylolisthesis, lumbosacral region: Secondary | ICD-10-CM | POA: Diagnosis not present

## 2022-12-18 DIAGNOSIS — M47816 Spondylosis without myelopathy or radiculopathy, lumbar region: Secondary | ICD-10-CM | POA: Diagnosis not present

## 2022-12-18 DIAGNOSIS — G629 Polyneuropathy, unspecified: Secondary | ICD-10-CM | POA: Diagnosis not present

## 2022-12-18 DIAGNOSIS — M4325 Fusion of spine, thoracolumbar region: Secondary | ICD-10-CM | POA: Diagnosis not present

## 2022-12-18 DIAGNOSIS — N189 Chronic kidney disease, unspecified: Secondary | ICD-10-CM | POA: Diagnosis not present

## 2022-12-18 DIAGNOSIS — M109 Gout, unspecified: Secondary | ICD-10-CM | POA: Diagnosis not present

## 2022-12-18 DIAGNOSIS — M48061 Spinal stenosis, lumbar region without neurogenic claudication: Secondary | ICD-10-CM | POA: Diagnosis not present

## 2022-12-18 DIAGNOSIS — I129 Hypertensive chronic kidney disease with stage 1 through stage 4 chronic kidney disease, or unspecified chronic kidney disease: Secondary | ICD-10-CM | POA: Diagnosis not present

## 2022-12-18 DIAGNOSIS — M7138 Other bursal cyst, other site: Secondary | ICD-10-CM | POA: Diagnosis not present

## 2022-12-19 DIAGNOSIS — G8929 Other chronic pain: Secondary | ICD-10-CM | POA: Diagnosis not present

## 2022-12-19 DIAGNOSIS — M79674 Pain in right toe(s): Secondary | ICD-10-CM | POA: Diagnosis not present

## 2022-12-19 DIAGNOSIS — B351 Tinea unguium: Secondary | ICD-10-CM | POA: Diagnosis not present

## 2022-12-19 DIAGNOSIS — G629 Polyneuropathy, unspecified: Secondary | ICD-10-CM | POA: Diagnosis not present

## 2022-12-19 DIAGNOSIS — M25562 Pain in left knee: Secondary | ICD-10-CM | POA: Diagnosis not present

## 2022-12-19 DIAGNOSIS — M25561 Pain in right knee: Secondary | ICD-10-CM | POA: Diagnosis not present

## 2022-12-19 DIAGNOSIS — M79675 Pain in left toe(s): Secondary | ICD-10-CM | POA: Diagnosis not present

## 2022-12-19 DIAGNOSIS — R200 Anesthesia of skin: Secondary | ICD-10-CM | POA: Diagnosis not present

## 2022-12-19 DIAGNOSIS — R202 Paresthesia of skin: Secondary | ICD-10-CM | POA: Diagnosis not present

## 2022-12-20 DIAGNOSIS — I129 Hypertensive chronic kidney disease with stage 1 through stage 4 chronic kidney disease, or unspecified chronic kidney disease: Secondary | ICD-10-CM | POA: Diagnosis not present

## 2022-12-20 DIAGNOSIS — M7138 Other bursal cyst, other site: Secondary | ICD-10-CM | POA: Diagnosis not present

## 2022-12-20 DIAGNOSIS — M4317 Spondylolisthesis, lumbosacral region: Secondary | ICD-10-CM | POA: Diagnosis not present

## 2022-12-20 DIAGNOSIS — M47816 Spondylosis without myelopathy or radiculopathy, lumbar region: Secondary | ICD-10-CM | POA: Diagnosis not present

## 2022-12-20 DIAGNOSIS — M48061 Spinal stenosis, lumbar region without neurogenic claudication: Secondary | ICD-10-CM | POA: Diagnosis not present

## 2022-12-20 DIAGNOSIS — G629 Polyneuropathy, unspecified: Secondary | ICD-10-CM | POA: Diagnosis not present

## 2022-12-20 DIAGNOSIS — M4325 Fusion of spine, thoracolumbar region: Secondary | ICD-10-CM | POA: Diagnosis not present

## 2022-12-20 DIAGNOSIS — M109 Gout, unspecified: Secondary | ICD-10-CM | POA: Diagnosis not present

## 2022-12-20 DIAGNOSIS — N189 Chronic kidney disease, unspecified: Secondary | ICD-10-CM | POA: Diagnosis not present

## 2022-12-25 DIAGNOSIS — N189 Chronic kidney disease, unspecified: Secondary | ICD-10-CM | POA: Diagnosis not present

## 2022-12-25 DIAGNOSIS — M7138 Other bursal cyst, other site: Secondary | ICD-10-CM | POA: Diagnosis not present

## 2022-12-25 DIAGNOSIS — M4317 Spondylolisthesis, lumbosacral region: Secondary | ICD-10-CM | POA: Diagnosis not present

## 2022-12-25 DIAGNOSIS — M47816 Spondylosis without myelopathy or radiculopathy, lumbar region: Secondary | ICD-10-CM | POA: Diagnosis not present

## 2022-12-25 DIAGNOSIS — M48061 Spinal stenosis, lumbar region without neurogenic claudication: Secondary | ICD-10-CM | POA: Diagnosis not present

## 2022-12-25 DIAGNOSIS — M4325 Fusion of spine, thoracolumbar region: Secondary | ICD-10-CM | POA: Diagnosis not present

## 2022-12-25 DIAGNOSIS — G629 Polyneuropathy, unspecified: Secondary | ICD-10-CM | POA: Diagnosis not present

## 2022-12-25 DIAGNOSIS — I129 Hypertensive chronic kidney disease with stage 1 through stage 4 chronic kidney disease, or unspecified chronic kidney disease: Secondary | ICD-10-CM | POA: Diagnosis not present

## 2022-12-25 DIAGNOSIS — M109 Gout, unspecified: Secondary | ICD-10-CM | POA: Diagnosis not present

## 2022-12-27 DIAGNOSIS — M4317 Spondylolisthesis, lumbosacral region: Secondary | ICD-10-CM | POA: Diagnosis not present

## 2022-12-27 DIAGNOSIS — M47816 Spondylosis without myelopathy or radiculopathy, lumbar region: Secondary | ICD-10-CM | POA: Diagnosis not present

## 2022-12-27 DIAGNOSIS — N189 Chronic kidney disease, unspecified: Secondary | ICD-10-CM | POA: Diagnosis not present

## 2022-12-27 DIAGNOSIS — M4325 Fusion of spine, thoracolumbar region: Secondary | ICD-10-CM | POA: Diagnosis not present

## 2022-12-27 DIAGNOSIS — M7138 Other bursal cyst, other site: Secondary | ICD-10-CM | POA: Diagnosis not present

## 2022-12-27 DIAGNOSIS — M48061 Spinal stenosis, lumbar region without neurogenic claudication: Secondary | ICD-10-CM | POA: Diagnosis not present

## 2022-12-27 DIAGNOSIS — M109 Gout, unspecified: Secondary | ICD-10-CM | POA: Diagnosis not present

## 2022-12-27 DIAGNOSIS — G629 Polyneuropathy, unspecified: Secondary | ICD-10-CM | POA: Diagnosis not present

## 2022-12-27 DIAGNOSIS — I129 Hypertensive chronic kidney disease with stage 1 through stage 4 chronic kidney disease, or unspecified chronic kidney disease: Secondary | ICD-10-CM | POA: Diagnosis not present

## 2023-01-04 DIAGNOSIS — G629 Polyneuropathy, unspecified: Secondary | ICD-10-CM | POA: Diagnosis not present

## 2023-01-04 DIAGNOSIS — M7138 Other bursal cyst, other site: Secondary | ICD-10-CM | POA: Diagnosis not present

## 2023-01-04 DIAGNOSIS — I129 Hypertensive chronic kidney disease with stage 1 through stage 4 chronic kidney disease, or unspecified chronic kidney disease: Secondary | ICD-10-CM | POA: Diagnosis not present

## 2023-01-04 DIAGNOSIS — M109 Gout, unspecified: Secondary | ICD-10-CM | POA: Diagnosis not present

## 2023-01-04 DIAGNOSIS — M4317 Spondylolisthesis, lumbosacral region: Secondary | ICD-10-CM | POA: Diagnosis not present

## 2023-01-04 DIAGNOSIS — M4325 Fusion of spine, thoracolumbar region: Secondary | ICD-10-CM | POA: Diagnosis not present

## 2023-01-04 DIAGNOSIS — M47816 Spondylosis without myelopathy or radiculopathy, lumbar region: Secondary | ICD-10-CM | POA: Diagnosis not present

## 2023-01-04 DIAGNOSIS — N189 Chronic kidney disease, unspecified: Secondary | ICD-10-CM | POA: Diagnosis not present

## 2023-01-04 DIAGNOSIS — M48061 Spinal stenosis, lumbar region without neurogenic claudication: Secondary | ICD-10-CM | POA: Diagnosis not present

## 2023-01-08 DIAGNOSIS — M170 Bilateral primary osteoarthritis of knee: Secondary | ICD-10-CM | POA: Diagnosis not present

## 2023-01-08 DIAGNOSIS — M25461 Effusion, right knee: Secondary | ICD-10-CM | POA: Diagnosis not present

## 2023-01-08 DIAGNOSIS — G8929 Other chronic pain: Secondary | ICD-10-CM | POA: Diagnosis not present

## 2023-01-08 DIAGNOSIS — M25462 Effusion, left knee: Secondary | ICD-10-CM | POA: Diagnosis not present

## 2023-01-10 DIAGNOSIS — M4325 Fusion of spine, thoracolumbar region: Secondary | ICD-10-CM | POA: Diagnosis not present

## 2023-01-10 DIAGNOSIS — M48061 Spinal stenosis, lumbar region without neurogenic claudication: Secondary | ICD-10-CM | POA: Diagnosis not present

## 2023-01-10 DIAGNOSIS — M4317 Spondylolisthesis, lumbosacral region: Secondary | ICD-10-CM | POA: Diagnosis not present

## 2023-01-10 DIAGNOSIS — M109 Gout, unspecified: Secondary | ICD-10-CM | POA: Diagnosis not present

## 2023-01-10 DIAGNOSIS — N189 Chronic kidney disease, unspecified: Secondary | ICD-10-CM | POA: Diagnosis not present

## 2023-01-10 DIAGNOSIS — M47816 Spondylosis without myelopathy or radiculopathy, lumbar region: Secondary | ICD-10-CM | POA: Diagnosis not present

## 2023-01-10 DIAGNOSIS — G629 Polyneuropathy, unspecified: Secondary | ICD-10-CM | POA: Diagnosis not present

## 2023-01-10 DIAGNOSIS — M7138 Other bursal cyst, other site: Secondary | ICD-10-CM | POA: Diagnosis not present

## 2023-01-10 DIAGNOSIS — I129 Hypertensive chronic kidney disease with stage 1 through stage 4 chronic kidney disease, or unspecified chronic kidney disease: Secondary | ICD-10-CM | POA: Diagnosis not present

## 2023-01-11 DIAGNOSIS — M47816 Spondylosis without myelopathy or radiculopathy, lumbar region: Secondary | ICD-10-CM | POA: Diagnosis not present

## 2023-01-11 DIAGNOSIS — M48061 Spinal stenosis, lumbar region without neurogenic claudication: Secondary | ICD-10-CM | POA: Diagnosis not present

## 2023-01-11 DIAGNOSIS — N189 Chronic kidney disease, unspecified: Secondary | ICD-10-CM | POA: Diagnosis not present

## 2023-01-11 DIAGNOSIS — M109 Gout, unspecified: Secondary | ICD-10-CM | POA: Diagnosis not present

## 2023-01-11 DIAGNOSIS — M4325 Fusion of spine, thoracolumbar region: Secondary | ICD-10-CM | POA: Diagnosis not present

## 2023-01-11 DIAGNOSIS — I129 Hypertensive chronic kidney disease with stage 1 through stage 4 chronic kidney disease, or unspecified chronic kidney disease: Secondary | ICD-10-CM | POA: Diagnosis not present

## 2023-01-11 DIAGNOSIS — M4317 Spondylolisthesis, lumbosacral region: Secondary | ICD-10-CM | POA: Diagnosis not present

## 2023-01-11 DIAGNOSIS — M7138 Other bursal cyst, other site: Secondary | ICD-10-CM | POA: Diagnosis not present

## 2023-01-11 DIAGNOSIS — G629 Polyneuropathy, unspecified: Secondary | ICD-10-CM | POA: Diagnosis not present

## 2023-01-15 DIAGNOSIS — N2581 Secondary hyperparathyroidism of renal origin: Secondary | ICD-10-CM | POA: Diagnosis not present

## 2023-01-15 DIAGNOSIS — I129 Hypertensive chronic kidney disease with stage 1 through stage 4 chronic kidney disease, or unspecified chronic kidney disease: Secondary | ICD-10-CM | POA: Diagnosis not present

## 2023-01-15 DIAGNOSIS — N1831 Chronic kidney disease, stage 3a: Secondary | ICD-10-CM | POA: Diagnosis not present

## 2023-01-16 DIAGNOSIS — M109 Gout, unspecified: Secondary | ICD-10-CM | POA: Diagnosis not present

## 2023-01-16 DIAGNOSIS — G629 Polyneuropathy, unspecified: Secondary | ICD-10-CM | POA: Diagnosis not present

## 2023-01-16 DIAGNOSIS — M4317 Spondylolisthesis, lumbosacral region: Secondary | ICD-10-CM | POA: Diagnosis not present

## 2023-01-16 DIAGNOSIS — M4325 Fusion of spine, thoracolumbar region: Secondary | ICD-10-CM | POA: Diagnosis not present

## 2023-01-16 DIAGNOSIS — I129 Hypertensive chronic kidney disease with stage 1 through stage 4 chronic kidney disease, or unspecified chronic kidney disease: Secondary | ICD-10-CM | POA: Diagnosis not present

## 2023-01-16 DIAGNOSIS — M47816 Spondylosis without myelopathy or radiculopathy, lumbar region: Secondary | ICD-10-CM | POA: Diagnosis not present

## 2023-01-16 DIAGNOSIS — M48061 Spinal stenosis, lumbar region without neurogenic claudication: Secondary | ICD-10-CM | POA: Diagnosis not present

## 2023-01-16 DIAGNOSIS — N189 Chronic kidney disease, unspecified: Secondary | ICD-10-CM | POA: Diagnosis not present

## 2023-01-16 DIAGNOSIS — M7138 Other bursal cyst, other site: Secondary | ICD-10-CM | POA: Diagnosis not present

## 2023-01-17 DIAGNOSIS — M7138 Other bursal cyst, other site: Secondary | ICD-10-CM | POA: Diagnosis not present

## 2023-01-17 DIAGNOSIS — M109 Gout, unspecified: Secondary | ICD-10-CM | POA: Diagnosis not present

## 2023-01-17 DIAGNOSIS — N189 Chronic kidney disease, unspecified: Secondary | ICD-10-CM | POA: Diagnosis not present

## 2023-01-17 DIAGNOSIS — M4325 Fusion of spine, thoracolumbar region: Secondary | ICD-10-CM | POA: Diagnosis not present

## 2023-01-17 DIAGNOSIS — I129 Hypertensive chronic kidney disease with stage 1 through stage 4 chronic kidney disease, or unspecified chronic kidney disease: Secondary | ICD-10-CM | POA: Diagnosis not present

## 2023-01-17 DIAGNOSIS — M4317 Spondylolisthesis, lumbosacral region: Secondary | ICD-10-CM | POA: Diagnosis not present

## 2023-01-17 DIAGNOSIS — M48061 Spinal stenosis, lumbar region without neurogenic claudication: Secondary | ICD-10-CM | POA: Diagnosis not present

## 2023-01-17 DIAGNOSIS — M47816 Spondylosis without myelopathy or radiculopathy, lumbar region: Secondary | ICD-10-CM | POA: Diagnosis not present

## 2023-01-17 DIAGNOSIS — G629 Polyneuropathy, unspecified: Secondary | ICD-10-CM | POA: Diagnosis not present

## 2023-01-18 DIAGNOSIS — M4317 Spondylolisthesis, lumbosacral region: Secondary | ICD-10-CM | POA: Diagnosis not present

## 2023-01-18 DIAGNOSIS — M47816 Spondylosis without myelopathy or radiculopathy, lumbar region: Secondary | ICD-10-CM | POA: Diagnosis not present

## 2023-01-18 DIAGNOSIS — M4325 Fusion of spine, thoracolumbar region: Secondary | ICD-10-CM | POA: Diagnosis not present

## 2023-01-18 DIAGNOSIS — M109 Gout, unspecified: Secondary | ICD-10-CM | POA: Diagnosis not present

## 2023-01-18 DIAGNOSIS — N189 Chronic kidney disease, unspecified: Secondary | ICD-10-CM | POA: Diagnosis not present

## 2023-01-18 DIAGNOSIS — M7138 Other bursal cyst, other site: Secondary | ICD-10-CM | POA: Diagnosis not present

## 2023-01-18 DIAGNOSIS — M48061 Spinal stenosis, lumbar region without neurogenic claudication: Secondary | ICD-10-CM | POA: Diagnosis not present

## 2023-01-18 DIAGNOSIS — G629 Polyneuropathy, unspecified: Secondary | ICD-10-CM | POA: Diagnosis not present

## 2023-01-18 DIAGNOSIS — I129 Hypertensive chronic kidney disease with stage 1 through stage 4 chronic kidney disease, or unspecified chronic kidney disease: Secondary | ICD-10-CM | POA: Diagnosis not present

## 2023-01-22 DIAGNOSIS — M7989 Other specified soft tissue disorders: Secondary | ICD-10-CM | POA: Diagnosis not present

## 2023-01-22 DIAGNOSIS — M4325 Fusion of spine, thoracolumbar region: Secondary | ICD-10-CM | POA: Diagnosis not present

## 2023-01-22 DIAGNOSIS — M4317 Spondylolisthesis, lumbosacral region: Secondary | ICD-10-CM | POA: Diagnosis not present

## 2023-01-22 DIAGNOSIS — M25562 Pain in left knee: Secondary | ICD-10-CM | POA: Diagnosis not present

## 2023-01-22 DIAGNOSIS — M25561 Pain in right knee: Secondary | ICD-10-CM | POA: Diagnosis not present

## 2023-01-22 DIAGNOSIS — M5489 Other dorsalgia: Secondary | ICD-10-CM | POA: Diagnosis not present

## 2023-01-22 DIAGNOSIS — M109 Gout, unspecified: Secondary | ICD-10-CM | POA: Diagnosis not present

## 2023-01-22 DIAGNOSIS — M47816 Spondylosis without myelopathy or radiculopathy, lumbar region: Secondary | ICD-10-CM | POA: Diagnosis not present

## 2023-01-22 DIAGNOSIS — I129 Hypertensive chronic kidney disease with stage 1 through stage 4 chronic kidney disease, or unspecified chronic kidney disease: Secondary | ICD-10-CM | POA: Diagnosis not present

## 2023-01-22 DIAGNOSIS — M48061 Spinal stenosis, lumbar region without neurogenic claudication: Secondary | ICD-10-CM | POA: Diagnosis not present

## 2023-01-22 DIAGNOSIS — N189 Chronic kidney disease, unspecified: Secondary | ICD-10-CM | POA: Diagnosis not present

## 2023-01-22 DIAGNOSIS — M7138 Other bursal cyst, other site: Secondary | ICD-10-CM | POA: Diagnosis not present

## 2023-01-22 DIAGNOSIS — R200 Anesthesia of skin: Secondary | ICD-10-CM | POA: Diagnosis not present

## 2023-01-22 DIAGNOSIS — R202 Paresthesia of skin: Secondary | ICD-10-CM | POA: Diagnosis not present

## 2023-01-22 DIAGNOSIS — G629 Polyneuropathy, unspecified: Secondary | ICD-10-CM | POA: Diagnosis not present

## 2023-01-23 DIAGNOSIS — I129 Hypertensive chronic kidney disease with stage 1 through stage 4 chronic kidney disease, or unspecified chronic kidney disease: Secondary | ICD-10-CM | POA: Diagnosis not present

## 2023-01-23 DIAGNOSIS — M7138 Other bursal cyst, other site: Secondary | ICD-10-CM | POA: Diagnosis not present

## 2023-01-23 DIAGNOSIS — M109 Gout, unspecified: Secondary | ICD-10-CM | POA: Diagnosis not present

## 2023-01-23 DIAGNOSIS — M4325 Fusion of spine, thoracolumbar region: Secondary | ICD-10-CM | POA: Diagnosis not present

## 2023-01-23 DIAGNOSIS — N189 Chronic kidney disease, unspecified: Secondary | ICD-10-CM | POA: Diagnosis not present

## 2023-01-23 DIAGNOSIS — G629 Polyneuropathy, unspecified: Secondary | ICD-10-CM | POA: Diagnosis not present

## 2023-01-23 DIAGNOSIS — M47816 Spondylosis without myelopathy or radiculopathy, lumbar region: Secondary | ICD-10-CM | POA: Diagnosis not present

## 2023-01-23 DIAGNOSIS — M48061 Spinal stenosis, lumbar region without neurogenic claudication: Secondary | ICD-10-CM | POA: Diagnosis not present

## 2023-01-23 DIAGNOSIS — M4317 Spondylolisthesis, lumbosacral region: Secondary | ICD-10-CM | POA: Diagnosis not present

## 2023-01-24 DIAGNOSIS — M48061 Spinal stenosis, lumbar region without neurogenic claudication: Secondary | ICD-10-CM | POA: Diagnosis not present

## 2023-01-24 DIAGNOSIS — I129 Hypertensive chronic kidney disease with stage 1 through stage 4 chronic kidney disease, or unspecified chronic kidney disease: Secondary | ICD-10-CM | POA: Diagnosis not present

## 2023-01-24 DIAGNOSIS — M4325 Fusion of spine, thoracolumbar region: Secondary | ICD-10-CM | POA: Diagnosis not present

## 2023-01-24 DIAGNOSIS — G629 Polyneuropathy, unspecified: Secondary | ICD-10-CM | POA: Diagnosis not present

## 2023-01-24 DIAGNOSIS — M7138 Other bursal cyst, other site: Secondary | ICD-10-CM | POA: Diagnosis not present

## 2023-01-24 DIAGNOSIS — M109 Gout, unspecified: Secondary | ICD-10-CM | POA: Diagnosis not present

## 2023-01-24 DIAGNOSIS — N189 Chronic kidney disease, unspecified: Secondary | ICD-10-CM | POA: Diagnosis not present

## 2023-01-24 DIAGNOSIS — M47816 Spondylosis without myelopathy or radiculopathy, lumbar region: Secondary | ICD-10-CM | POA: Diagnosis not present

## 2023-01-24 DIAGNOSIS — M4317 Spondylolisthesis, lumbosacral region: Secondary | ICD-10-CM | POA: Diagnosis not present

## 2023-01-29 DIAGNOSIS — M4325 Fusion of spine, thoracolumbar region: Secondary | ICD-10-CM | POA: Diagnosis not present

## 2023-01-29 DIAGNOSIS — G629 Polyneuropathy, unspecified: Secondary | ICD-10-CM | POA: Diagnosis not present

## 2023-01-29 DIAGNOSIS — M47816 Spondylosis without myelopathy or radiculopathy, lumbar region: Secondary | ICD-10-CM | POA: Diagnosis not present

## 2023-01-29 DIAGNOSIS — M109 Gout, unspecified: Secondary | ICD-10-CM | POA: Diagnosis not present

## 2023-01-29 DIAGNOSIS — N189 Chronic kidney disease, unspecified: Secondary | ICD-10-CM | POA: Diagnosis not present

## 2023-01-29 DIAGNOSIS — M48061 Spinal stenosis, lumbar region without neurogenic claudication: Secondary | ICD-10-CM | POA: Diagnosis not present

## 2023-01-29 DIAGNOSIS — M4317 Spondylolisthesis, lumbosacral region: Secondary | ICD-10-CM | POA: Diagnosis not present

## 2023-01-29 DIAGNOSIS — M7138 Other bursal cyst, other site: Secondary | ICD-10-CM | POA: Diagnosis not present

## 2023-01-29 DIAGNOSIS — I129 Hypertensive chronic kidney disease with stage 1 through stage 4 chronic kidney disease, or unspecified chronic kidney disease: Secondary | ICD-10-CM | POA: Diagnosis not present

## 2023-01-30 DIAGNOSIS — M109 Gout, unspecified: Secondary | ICD-10-CM | POA: Diagnosis not present

## 2023-01-30 DIAGNOSIS — N189 Chronic kidney disease, unspecified: Secondary | ICD-10-CM | POA: Diagnosis not present

## 2023-01-30 DIAGNOSIS — M4325 Fusion of spine, thoracolumbar region: Secondary | ICD-10-CM | POA: Diagnosis not present

## 2023-01-30 DIAGNOSIS — M47816 Spondylosis without myelopathy or radiculopathy, lumbar region: Secondary | ICD-10-CM | POA: Diagnosis not present

## 2023-01-30 DIAGNOSIS — G629 Polyneuropathy, unspecified: Secondary | ICD-10-CM | POA: Diagnosis not present

## 2023-01-30 DIAGNOSIS — M7138 Other bursal cyst, other site: Secondary | ICD-10-CM | POA: Diagnosis not present

## 2023-01-30 DIAGNOSIS — M48061 Spinal stenosis, lumbar region without neurogenic claudication: Secondary | ICD-10-CM | POA: Diagnosis not present

## 2023-01-30 DIAGNOSIS — I129 Hypertensive chronic kidney disease with stage 1 through stage 4 chronic kidney disease, or unspecified chronic kidney disease: Secondary | ICD-10-CM | POA: Diagnosis not present

## 2023-01-30 DIAGNOSIS — M4317 Spondylolisthesis, lumbosacral region: Secondary | ICD-10-CM | POA: Diagnosis not present

## 2023-02-05 DIAGNOSIS — M47816 Spondylosis without myelopathy or radiculopathy, lumbar region: Secondary | ICD-10-CM | POA: Diagnosis not present

## 2023-02-05 DIAGNOSIS — M48061 Spinal stenosis, lumbar region without neurogenic claudication: Secondary | ICD-10-CM | POA: Diagnosis not present

## 2023-02-05 DIAGNOSIS — M109 Gout, unspecified: Secondary | ICD-10-CM | POA: Diagnosis not present

## 2023-02-05 DIAGNOSIS — M4325 Fusion of spine, thoracolumbar region: Secondary | ICD-10-CM | POA: Diagnosis not present

## 2023-02-05 DIAGNOSIS — I129 Hypertensive chronic kidney disease with stage 1 through stage 4 chronic kidney disease, or unspecified chronic kidney disease: Secondary | ICD-10-CM | POA: Diagnosis not present

## 2023-02-05 DIAGNOSIS — N189 Chronic kidney disease, unspecified: Secondary | ICD-10-CM | POA: Diagnosis not present

## 2023-02-05 DIAGNOSIS — M4317 Spondylolisthesis, lumbosacral region: Secondary | ICD-10-CM | POA: Diagnosis not present

## 2023-02-05 DIAGNOSIS — M7138 Other bursal cyst, other site: Secondary | ICD-10-CM | POA: Diagnosis not present

## 2023-02-05 DIAGNOSIS — G629 Polyneuropathy, unspecified: Secondary | ICD-10-CM | POA: Diagnosis not present

## 2023-02-14 DIAGNOSIS — M4317 Spondylolisthesis, lumbosacral region: Secondary | ICD-10-CM | POA: Diagnosis not present

## 2023-02-14 DIAGNOSIS — M48061 Spinal stenosis, lumbar region without neurogenic claudication: Secondary | ICD-10-CM | POA: Diagnosis not present

## 2023-02-14 DIAGNOSIS — M47816 Spondylosis without myelopathy or radiculopathy, lumbar region: Secondary | ICD-10-CM | POA: Diagnosis not present

## 2023-02-14 DIAGNOSIS — N189 Chronic kidney disease, unspecified: Secondary | ICD-10-CM | POA: Diagnosis not present

## 2023-02-14 DIAGNOSIS — M109 Gout, unspecified: Secondary | ICD-10-CM | POA: Diagnosis not present

## 2023-02-14 DIAGNOSIS — G629 Polyneuropathy, unspecified: Secondary | ICD-10-CM | POA: Diagnosis not present

## 2023-02-14 DIAGNOSIS — M7138 Other bursal cyst, other site: Secondary | ICD-10-CM | POA: Diagnosis not present

## 2023-02-14 DIAGNOSIS — I129 Hypertensive chronic kidney disease with stage 1 through stage 4 chronic kidney disease, or unspecified chronic kidney disease: Secondary | ICD-10-CM | POA: Diagnosis not present

## 2023-02-14 DIAGNOSIS — M4325 Fusion of spine, thoracolumbar region: Secondary | ICD-10-CM | POA: Diagnosis not present

## 2023-02-22 DIAGNOSIS — G629 Polyneuropathy, unspecified: Secondary | ICD-10-CM | POA: Diagnosis not present

## 2023-02-22 DIAGNOSIS — M4325 Fusion of spine, thoracolumbar region: Secondary | ICD-10-CM | POA: Diagnosis not present

## 2023-02-22 DIAGNOSIS — I129 Hypertensive chronic kidney disease with stage 1 through stage 4 chronic kidney disease, or unspecified chronic kidney disease: Secondary | ICD-10-CM | POA: Diagnosis not present

## 2023-02-22 DIAGNOSIS — M47816 Spondylosis without myelopathy or radiculopathy, lumbar region: Secondary | ICD-10-CM | POA: Diagnosis not present

## 2023-02-22 DIAGNOSIS — N189 Chronic kidney disease, unspecified: Secondary | ICD-10-CM | POA: Diagnosis not present

## 2023-02-22 DIAGNOSIS — M4317 Spondylolisthesis, lumbosacral region: Secondary | ICD-10-CM | POA: Diagnosis not present

## 2023-02-22 DIAGNOSIS — M48061 Spinal stenosis, lumbar region without neurogenic claudication: Secondary | ICD-10-CM | POA: Diagnosis not present

## 2023-02-22 DIAGNOSIS — M109 Gout, unspecified: Secondary | ICD-10-CM | POA: Diagnosis not present

## 2023-02-22 DIAGNOSIS — M7138 Other bursal cyst, other site: Secondary | ICD-10-CM | POA: Diagnosis not present

## 2023-02-27 DIAGNOSIS — M109 Gout, unspecified: Secondary | ICD-10-CM | POA: Diagnosis not present

## 2023-02-27 DIAGNOSIS — R7303 Prediabetes: Secondary | ICD-10-CM | POA: Diagnosis not present

## 2023-02-27 DIAGNOSIS — I10 Essential (primary) hypertension: Secondary | ICD-10-CM | POA: Diagnosis not present

## 2023-02-27 DIAGNOSIS — E782 Mixed hyperlipidemia: Secondary | ICD-10-CM | POA: Diagnosis not present

## 2023-02-27 DIAGNOSIS — N1831 Chronic kidney disease, stage 3a: Secondary | ICD-10-CM | POA: Diagnosis not present

## 2023-03-06 ENCOUNTER — Other Ambulatory Visit: Payer: Self-pay | Admitting: Physician Assistant

## 2023-03-06 DIAGNOSIS — N1831 Chronic kidney disease, stage 3a: Secondary | ICD-10-CM | POA: Diagnosis not present

## 2023-03-06 DIAGNOSIS — I129 Hypertensive chronic kidney disease with stage 1 through stage 4 chronic kidney disease, or unspecified chronic kidney disease: Secondary | ICD-10-CM | POA: Diagnosis not present

## 2023-03-06 DIAGNOSIS — E782 Mixed hyperlipidemia: Secondary | ICD-10-CM | POA: Diagnosis not present

## 2023-03-06 DIAGNOSIS — Z1231 Encounter for screening mammogram for malignant neoplasm of breast: Secondary | ICD-10-CM

## 2023-03-06 DIAGNOSIS — E669 Obesity, unspecified: Secondary | ICD-10-CM | POA: Diagnosis not present

## 2023-03-06 DIAGNOSIS — R7303 Prediabetes: Secondary | ICD-10-CM | POA: Diagnosis not present

## 2023-03-06 DIAGNOSIS — Z6841 Body Mass Index (BMI) 40.0 and over, adult: Secondary | ICD-10-CM | POA: Diagnosis not present

## 2023-03-06 DIAGNOSIS — Z0000 Encounter for general adult medical examination without abnormal findings: Secondary | ICD-10-CM | POA: Diagnosis not present

## 2023-03-15 ENCOUNTER — Ambulatory Visit
Admission: RE | Admit: 2023-03-15 | Discharge: 2023-03-15 | Disposition: A | Discharge status: Home / Self Care | Payer: Medicare HMO | Source: Ambulatory Visit | Attending: Physician Assistant | Admitting: Physician Assistant

## 2023-03-15 DIAGNOSIS — Z1231 Encounter for screening mammogram for malignant neoplasm of breast: Secondary | ICD-10-CM

## 2023-03-19 DIAGNOSIS — M25561 Pain in right knee: Secondary | ICD-10-CM | POA: Diagnosis not present

## 2023-03-19 DIAGNOSIS — R202 Paresthesia of skin: Secondary | ICD-10-CM | POA: Diagnosis not present

## 2023-03-19 DIAGNOSIS — M5489 Other dorsalgia: Secondary | ICD-10-CM | POA: Diagnosis not present

## 2023-03-19 DIAGNOSIS — Z9181 History of falling: Secondary | ICD-10-CM | POA: Diagnosis not present

## 2023-03-19 DIAGNOSIS — R200 Anesthesia of skin: Secondary | ICD-10-CM | POA: Diagnosis not present

## 2023-03-19 DIAGNOSIS — G8929 Other chronic pain: Secondary | ICD-10-CM | POA: Diagnosis not present

## 2023-03-19 DIAGNOSIS — M25562 Pain in left knee: Secondary | ICD-10-CM | POA: Diagnosis not present

## 2023-03-19 DIAGNOSIS — M7989 Other specified soft tissue disorders: Secondary | ICD-10-CM | POA: Diagnosis not present

## 2023-03-19 DIAGNOSIS — G629 Polyneuropathy, unspecified: Secondary | ICD-10-CM | POA: Diagnosis not present

## 2023-03-25 DIAGNOSIS — G629 Polyneuropathy, unspecified: Secondary | ICD-10-CM | POA: Diagnosis not present

## 2023-03-25 DIAGNOSIS — N179 Acute kidney failure, unspecified: Secondary | ICD-10-CM | POA: Diagnosis not present

## 2023-03-25 DIAGNOSIS — I5032 Chronic diastolic (congestive) heart failure: Secondary | ICD-10-CM | POA: Diagnosis not present

## 2023-03-25 DIAGNOSIS — Z6841 Body Mass Index (BMI) 40.0 and over, adult: Secondary | ICD-10-CM | POA: Diagnosis not present

## 2023-03-25 DIAGNOSIS — N1832 Chronic kidney disease, stage 3b: Secondary | ICD-10-CM | POA: Diagnosis not present

## 2023-03-25 DIAGNOSIS — J9622 Acute and chronic respiratory failure with hypercapnia: Secondary | ICD-10-CM | POA: Diagnosis not present

## 2023-03-25 DIAGNOSIS — J9621 Acute and chronic respiratory failure with hypoxia: Secondary | ICD-10-CM | POA: Diagnosis not present

## 2023-03-25 DIAGNOSIS — I130 Hypertensive heart and chronic kidney disease with heart failure and stage 1 through stage 4 chronic kidney disease, or unspecified chronic kidney disease: Secondary | ICD-10-CM | POA: Diagnosis not present

## 2023-03-26 ENCOUNTER — Inpatient Hospital Stay
Admission: EM | Admit: 2023-03-26 | Discharge: 2023-03-28 | DRG: 189 | Disposition: A | Discharge status: Home Health | Payer: Medicare HMO | Attending: Internal Medicine | Admitting: Internal Medicine

## 2023-03-26 ENCOUNTER — Emergency Department: Payer: Medicare HMO

## 2023-03-26 ENCOUNTER — Encounter: Payer: Self-pay | Admitting: Emergency Medicine

## 2023-03-26 ENCOUNTER — Other Ambulatory Visit: Payer: Self-pay

## 2023-03-26 DIAGNOSIS — J398 Other specified diseases of upper respiratory tract: Secondary | ICD-10-CM

## 2023-03-26 DIAGNOSIS — J9622 Acute and chronic respiratory failure with hypercapnia: Secondary | ICD-10-CM | POA: Diagnosis present

## 2023-03-26 DIAGNOSIS — I10 Essential (primary) hypertension: Secondary | ICD-10-CM | POA: Diagnosis present

## 2023-03-26 DIAGNOSIS — J9621 Acute and chronic respiratory failure with hypoxia: Principal | ICD-10-CM | POA: Diagnosis present

## 2023-03-26 DIAGNOSIS — R296 Repeated falls: Secondary | ICD-10-CM

## 2023-03-26 DIAGNOSIS — Z79899 Other long term (current) drug therapy: Secondary | ICD-10-CM

## 2023-03-26 DIAGNOSIS — Z7982 Long term (current) use of aspirin: Secondary | ICD-10-CM | POA: Diagnosis not present

## 2023-03-26 DIAGNOSIS — R55 Syncope and collapse: Secondary | ICD-10-CM | POA: Diagnosis not present

## 2023-03-26 DIAGNOSIS — K7689 Other specified diseases of liver: Secondary | ICD-10-CM | POA: Diagnosis not present

## 2023-03-26 DIAGNOSIS — K219 Gastro-esophageal reflux disease without esophagitis: Secondary | ICD-10-CM | POA: Diagnosis present

## 2023-03-26 DIAGNOSIS — R918 Other nonspecific abnormal finding of lung field: Secondary | ICD-10-CM | POA: Diagnosis not present

## 2023-03-26 DIAGNOSIS — Z803 Family history of malignant neoplasm of breast: Secondary | ICD-10-CM | POA: Diagnosis not present

## 2023-03-26 DIAGNOSIS — N1832 Chronic kidney disease, stage 3b: Secondary | ICD-10-CM | POA: Insufficient documentation

## 2023-03-26 DIAGNOSIS — I272 Pulmonary hypertension, unspecified: Secondary | ICD-10-CM | POA: Diagnosis present

## 2023-03-26 DIAGNOSIS — G4733 Obstructive sleep apnea (adult) (pediatric): Secondary | ICD-10-CM | POA: Diagnosis present

## 2023-03-26 DIAGNOSIS — I4891 Unspecified atrial fibrillation: Secondary | ICD-10-CM | POA: Diagnosis present

## 2023-03-26 DIAGNOSIS — J9601 Acute respiratory failure with hypoxia: Secondary | ICD-10-CM | POA: Diagnosis not present

## 2023-03-26 DIAGNOSIS — G629 Polyneuropathy, unspecified: Secondary | ICD-10-CM | POA: Diagnosis present

## 2023-03-26 DIAGNOSIS — M109 Gout, unspecified: Secondary | ICD-10-CM | POA: Diagnosis present

## 2023-03-26 DIAGNOSIS — W19XXXA Unspecified fall, initial encounter: Secondary | ICD-10-CM | POA: Diagnosis not present

## 2023-03-26 DIAGNOSIS — Z1152 Encounter for screening for COVID-19: Secondary | ICD-10-CM

## 2023-03-26 DIAGNOSIS — R29898 Other symptoms and signs involving the musculoskeletal system: Secondary | ICD-10-CM | POA: Diagnosis not present

## 2023-03-26 DIAGNOSIS — Z9049 Acquired absence of other specified parts of digestive tract: Secondary | ICD-10-CM | POA: Diagnosis not present

## 2023-03-26 DIAGNOSIS — J9809 Other diseases of bronchus, not elsewhere classified: Secondary | ICD-10-CM | POA: Diagnosis present

## 2023-03-26 DIAGNOSIS — R531 Weakness: Secondary | ICD-10-CM | POA: Diagnosis present

## 2023-03-26 DIAGNOSIS — Z043 Encounter for examination and observation following other accident: Secondary | ICD-10-CM | POA: Diagnosis not present

## 2023-03-26 DIAGNOSIS — R162 Hepatomegaly with splenomegaly, not elsewhere classified: Secondary | ICD-10-CM | POA: Diagnosis not present

## 2023-03-26 DIAGNOSIS — I1310 Hypertensive heart and chronic kidney disease without heart failure, with stage 1 through stage 4 chronic kidney disease, or unspecified chronic kidney disease: Secondary | ICD-10-CM | POA: Diagnosis present

## 2023-03-26 DIAGNOSIS — J811 Chronic pulmonary edema: Secondary | ICD-10-CM | POA: Diagnosis not present

## 2023-03-26 DIAGNOSIS — Z6841 Body Mass Index (BMI) 40.0 and over, adult: Secondary | ICD-10-CM | POA: Diagnosis not present

## 2023-03-26 DIAGNOSIS — J9602 Acute respiratory failure with hypercapnia: Secondary | ICD-10-CM | POA: Diagnosis not present

## 2023-03-26 DIAGNOSIS — Z8249 Family history of ischemic heart disease and other diseases of the circulatory system: Secondary | ICD-10-CM

## 2023-03-26 DIAGNOSIS — I517 Cardiomegaly: Secondary | ICD-10-CM | POA: Diagnosis not present

## 2023-03-26 LAB — CBC WITH DIFFERENTIAL/PLATELET
Abs Immature Granulocytes: 0.06 10*3/uL (ref 0.00–0.07)
Basophils Absolute: 0.1 10*3/uL (ref 0.0–0.1)
Basophils Relative: 1 %
Eosinophils Absolute: 0.1 10*3/uL (ref 0.0–0.5)
Eosinophils Relative: 1 %
HCT: 39.9 % (ref 36.0–46.0)
Hemoglobin: 12 g/dL (ref 12.0–15.0)
Immature Granulocytes: 1 %
Lymphocytes Relative: 14 %
Lymphs Abs: 1.4 10*3/uL (ref 0.7–4.0)
MCH: 25.9 pg — ABNORMAL LOW (ref 26.0–34.0)
MCHC: 30.1 g/dL (ref 30.0–36.0)
MCV: 86 fL (ref 80.0–100.0)
Monocytes Absolute: 0.5 10*3/uL (ref 0.1–1.0)
Monocytes Relative: 5 %
Neutro Abs: 8.1 10*3/uL — ABNORMAL HIGH (ref 1.7–7.7)
Neutrophils Relative %: 78 %
Platelets: 267 10*3/uL (ref 150–400)
RBC: 4.64 MIL/uL (ref 3.87–5.11)
RDW: 16.8 % — ABNORMAL HIGH (ref 11.5–15.5)
WBC: 10.1 10*3/uL (ref 4.0–10.5)
nRBC: 0 % (ref 0.0–0.2)

## 2023-03-26 LAB — BASIC METABOLIC PANEL
Anion gap: 10 (ref 5–15)
BUN: 25 mg/dL — ABNORMAL HIGH (ref 8–23)
CO2: 29 mmol/L (ref 22–32)
Calcium: 9.1 mg/dL (ref 8.9–10.3)
Chloride: 95 mmol/L — ABNORMAL LOW (ref 98–111)
Creatinine, Ser: 1.45 mg/dL — ABNORMAL HIGH (ref 0.44–1.00)
GFR, Estimated: 38 mL/min — ABNORMAL LOW (ref >60)
Glucose, Bld: 107 mg/dL — ABNORMAL HIGH (ref 70–99)
Potassium: 4.6 mmol/L (ref 3.5–5.1)
Sodium: 134 mmol/L — ABNORMAL LOW (ref 135–145)

## 2023-03-26 NOTE — ED Provider Notes (Signed)
[facility] Provider Note    Event Date/Time   First MD Initiated Contact with Patient 03/26/23 2300     (approximate)   History   Fall (/)   HPI  Breanna Lawson is a 77 y.o. female here with generalized weakness and shortness of breath.  The patient has fallen twice in the last several days.  She fell earlier today and was noted to be hypoxic with EMS.  She states that she feels somewhat short of breath denies any other complaints.  She was in the 29s with EMS.  She not normally wear oxygen.  She does states she had some mild swelling in her legs.  She states she takes Lasix intermittently.  Denies any chest pain.  No fevers or chills.     Physical Exam   Triage Vital Signs: ED Triage Vitals  Enc Vitals Group     BP 03/26/23 1702 126/69     Pulse Rate 03/26/23 1702 74     Resp 03/26/23 1702 18     Temp 03/26/23 1702 98.1 F (36.7 C)     Temp Source 03/26/23 1702 Oral     SpO2 03/26/23 1702 90 %     Weight --      Height --      Head Circumference --      Peak Flow --      Pain Score 03/26/23 1703 0     Pain Loc --      Pain Edu? --      Excl. in GC? --     Most recent vital signs: Vitals:   03/27/23 0030 03/27/23 0230  BP: 136/65 (!) 144/82  Pulse: 67 73  Resp: 20 19  Temp:  98.2 F (36.8 C)  SpO2: 100% 95%     General: Awake, no distress.  CV:  Good peripheral perfusion.  Resp:  Normal work of breathing.  Bilateral rales, diminished aeration noted. Abd:  No distention.  Other:  Trace bilateral lower extremity edema.   ED Results / Procedures / Treatments   Labs (all labs ordered are listed, but only abnormal results are displayed) Labs Reviewed  CBC WITH DIFFERENTIAL/PLATELET - Abnormal; Notable for the following components:      Result Value   MCH 25.9 (*)    RDW 16.8 (*)    Neutro Abs 8.1 (*)    All other components within normal limits  BASIC METABOLIC PANEL - Abnormal; Notable for the following components:    Sodium 134 (*)    Chloride 95 (*)    Glucose, Bld 107 (*)    BUN 25 (*)    Creatinine, Ser 1.45 (*)    GFR, Estimated 38 (*)    All other components within normal limits  BLOOD GAS, VENOUS - Abnormal; Notable for the following components:   pCO2, Ven 76 (*)    pO2, Ven 49 (*)    Bicarbonate 37.4 (*)    Acid-Base Excess 8.0 (*)    All other components within normal limits  SARS CORONAVIRUS 2 BY RT PCR  BRAIN NATRIURETIC PEPTIDE  PROCALCITONIN  URINALYSIS, ROUTINE W REFLEX MICROSCOPIC  TROPONIN I (HIGH SENSITIVITY)     EKG Normal sinus rhythm, VR 72. PR 832, QRS 220, QTc 407. No acute St elevations or depressions. No ischemia or infarct.   RADIOLOGY Chest x-ray: Cardiomegaly with mild interstitial edema mild right infrahilar atelectasis   I also independently reviewed and agree with radiologist interpretations.   PROCEDURES:  Critical Care performed: No  .1-3 Lead EKG Interpretation  Performed by: Shaune Pollack, MD Authorized by: Shaune Pollack, MD     Interpretation: normal     ECG rate:  60-80   ECG rate assessment: normal     Rhythm: sinus rhythm     Ectopy: none     Conduction: normal   Comments:     Indication: SOB  .Critical Care  Performed by: Shaune Pollack, MD Authorized by: Shaune Pollack, MD   Critical care provider statement:    Critical care time (minutes):  30   Critical care was necessary to treat or prevent imminent or life-threatening deterioration of the following conditions:  Circulatory failure, cardiac failure and respiratory failure   Critical care was time spent personally by me on the following activities:  Development of treatment plan with patient or surrogate, discussions with consultants, evaluation of patient's response to treatment, examination of patient, ordering and review of laboratory studies, ordering and review of radiographic studies, ordering and performing treatments and interventions, pulse oximetry, re-evaluation of  patient's condition and review of old charts   Care discussed with: admitting provider       MEDICATIONS ORDERED IN ED: Medications  furosemide (LASIX) injection 40 mg (has no administration in time range)  ipratropium-albuterol (DUONEB) 0.5-2.5 (3) MG/3ML nebulizer solution 3 mL (3 mLs Nebulization Given 03/27/23 0246)  iohexol (OMNIPAQUE) 350 MG/ML injection 60 mL (60 mLs Intravenous Contrast Given 03/27/23 0219)     IMPRESSION / MDM / ASSESSMENT AND PLAN / ED COURSE  I reviewed the triage vital signs and the nursing notes.                              Differential diagnosis includes, but is not limited to, CHF, PNA, ACS, anemia, atelectasis, PE  Patient's presentation is most consistent with acute presentation with potential threat to life or bodily function.  The patient is on the cardiac monitor to evaluate for evidence of arrhythmia and/or significant heart rate changes  77 yo F with h/o HTN, CKD, GERD, here with SOB, weakness, falls. Unclear etiology - pt is hypoxic here, question of possible PNA, CHF, PE. EKG is non ischemic.  Initial labs overall reassuring. WBC normal. Hgb normal. BMP with mild but baseline CKD. Procal negative. CXR read as no PNA. BNP normal. COVID negative. Trop negative. Will f/u CT Angio, give breathing tx as she has been given this before, and plan to admit for hypoxia, weakness.  Blood gas shows likely hypoxic and hypercapneic resp failure, suspect this is multifactorial 2/2 mild CHF, obesity hypoventilation, ? Bronchitis, BIPAP, duonebs started and will admit. Dose of lasix given as well.   FINAL CLINICAL IMPRESSION(S) / ED DIAGNOSES   Final diagnoses:  Acute respiratory failure with hypoxia and hypercapnia (HCC)  Pulmonary hypertension (HCC)     Rx / DC Orders   ED Discharge Orders     None        Note:  This document was prepared using Dragon voice recognition software and may include unintentional dictation errors.   Shaune Pollack, MD 03/27/23 [phone]

## 2023-03-26 NOTE — ED Triage Notes (Signed)
Patient to ED via [facility] from home after a fall. FD helped patient back up from chair and did not want to be seen. PT came out to the house for the first time today and states patient O2 dropped when ambulating. Patient uses walker and wheelchair at baseline. Patient has not complaints at this time. 90% on room air while sitting in triage- 96% on 2L [place].

## 2023-03-26 NOTE — ED Notes (Signed)
First Nurse Note: Pt to ED via [facility]. Pt fell this morning and pushed her life line, FD came and put her back in her chair. Pt refused to be transported to the ED. PT came to evaluate pt and was concerned about patients mobility, pt said she was at her baseline. Pts SpO2 does drop when patient ambulates. Pt does not wear O2 at home.

## 2023-03-27 ENCOUNTER — Emergency Department: Payer: Medicare HMO

## 2023-03-27 DIAGNOSIS — Z803 Family history of malignant neoplasm of breast: Secondary | ICD-10-CM | POA: Diagnosis not present

## 2023-03-27 DIAGNOSIS — J9622 Acute and chronic respiratory failure with hypercapnia: Secondary | ICD-10-CM

## 2023-03-27 DIAGNOSIS — J9809 Other diseases of bronchus, not elsewhere classified: Secondary | ICD-10-CM | POA: Diagnosis present

## 2023-03-27 DIAGNOSIS — Z6841 Body Mass Index (BMI) 40.0 and over, adult: Secondary | ICD-10-CM | POA: Diagnosis not present

## 2023-03-27 DIAGNOSIS — M109 Gout, unspecified: Secondary | ICD-10-CM | POA: Diagnosis present

## 2023-03-27 DIAGNOSIS — Z8249 Family history of ischemic heart disease and other diseases of the circulatory system: Secondary | ICD-10-CM | POA: Diagnosis not present

## 2023-03-27 DIAGNOSIS — Z79899 Other long term (current) drug therapy: Secondary | ICD-10-CM | POA: Diagnosis not present

## 2023-03-27 DIAGNOSIS — R55 Syncope and collapse: Secondary | ICD-10-CM | POA: Diagnosis not present

## 2023-03-27 DIAGNOSIS — J398 Other specified diseases of upper respiratory tract: Secondary | ICD-10-CM

## 2023-03-27 DIAGNOSIS — Z7982 Long term (current) use of aspirin: Secondary | ICD-10-CM | POA: Diagnosis not present

## 2023-03-27 DIAGNOSIS — N1832 Chronic kidney disease, stage 3b: Secondary | ICD-10-CM | POA: Diagnosis present

## 2023-03-27 DIAGNOSIS — K219 Gastro-esophageal reflux disease without esophagitis: Secondary | ICD-10-CM | POA: Diagnosis present

## 2023-03-27 DIAGNOSIS — G629 Polyneuropathy, unspecified: Secondary | ICD-10-CM | POA: Diagnosis present

## 2023-03-27 DIAGNOSIS — J9621 Acute and chronic respiratory failure with hypoxia: Secondary | ICD-10-CM | POA: Diagnosis present

## 2023-03-27 DIAGNOSIS — G4733 Obstructive sleep apnea (adult) (pediatric): Secondary | ICD-10-CM | POA: Diagnosis present

## 2023-03-27 DIAGNOSIS — I272 Pulmonary hypertension, unspecified: Secondary | ICD-10-CM

## 2023-03-27 DIAGNOSIS — I1310 Hypertensive heart and chronic kidney disease without heart failure, with stage 1 through stage 4 chronic kidney disease, or unspecified chronic kidney disease: Secondary | ICD-10-CM | POA: Diagnosis present

## 2023-03-27 DIAGNOSIS — R296 Repeated falls: Secondary | ICD-10-CM | POA: Diagnosis present

## 2023-03-27 DIAGNOSIS — Z1152 Encounter for screening for COVID-19: Secondary | ICD-10-CM | POA: Diagnosis not present

## 2023-03-27 DIAGNOSIS — I4891 Unspecified atrial fibrillation: Secondary | ICD-10-CM | POA: Diagnosis present

## 2023-03-27 DIAGNOSIS — Z9049 Acquired absence of other specified parts of digestive tract: Secondary | ICD-10-CM | POA: Diagnosis not present

## 2023-03-27 DIAGNOSIS — R531 Weakness: Secondary | ICD-10-CM | POA: Diagnosis present

## 2023-03-27 LAB — URINALYSIS, ROUTINE W REFLEX MICROSCOPIC
Bilirubin Urine: NEGATIVE
Glucose, UA: NEGATIVE mg/dL
Hgb urine dipstick: NEGATIVE
Ketones, ur: NEGATIVE mg/dL
Leukocytes,Ua: NEGATIVE
Nitrite: NEGATIVE
Protein, ur: NEGATIVE mg/dL
Specific Gravity, Urine: 1.011 (ref 1.005–1.030)
pH: 5 (ref 5.0–8.0)

## 2023-03-27 LAB — BLOOD GAS, VENOUS
Acid-Base Excess: 8 mmol/L — ABNORMAL HIGH (ref 0.0–2.0)
Bicarbonate: 37.4 mmol/L — ABNORMAL HIGH (ref 20.0–28.0)
O2 Saturation: 77.2 %
Patient temperature: 37
pCO2, Ven: 76 mmHg (ref 44–60)
pH, Ven: 7.3 (ref 7.25–7.43)
pO2, Ven: 49 mmHg — ABNORMAL HIGH (ref 32–45)

## 2023-03-27 LAB — TROPONIN I (HIGH SENSITIVITY): Troponin I (High Sensitivity): 7 ng/L (ref <18)

## 2023-03-27 LAB — CBC
HCT: 39.5 % (ref 36.0–46.0)
Hemoglobin: 11.6 g/dL — ABNORMAL LOW (ref 12.0–15.0)
MCH: 25.6 pg — ABNORMAL LOW (ref 26.0–34.0)
MCHC: 29.4 g/dL — ABNORMAL LOW (ref 30.0–36.0)
MCV: 87 fL (ref 80.0–100.0)
Platelets: 262 10*3/uL (ref 150–400)
RBC: 4.54 MIL/uL (ref 3.87–5.11)
RDW: 16.6 % — ABNORMAL HIGH (ref 11.5–15.5)
WBC: 9.3 10*3/uL (ref 4.0–10.5)
nRBC: 0 % (ref 0.0–0.2)

## 2023-03-27 LAB — PROCALCITONIN: Procalcitonin: <0.1 ng/mL

## 2023-03-27 LAB — CREATININE, SERUM
Creatinine, Ser: 1.43 mg/dL — ABNORMAL HIGH (ref 0.44–1.00)
GFR, Estimated: 38 mL/min — ABNORMAL LOW (ref >60)

## 2023-03-27 LAB — SARS CORONAVIRUS 2 BY RT PCR: SARS Coronavirus 2 by RT PCR: NEGATIVE

## 2023-03-27 LAB — BRAIN NATRIURETIC PEPTIDE: B Natriuretic Peptide: 23.4 pg/mL (ref 0.0–100.0)

## 2023-03-27 LAB — TSH: TSH: 4.563 u[IU]/mL — ABNORMAL HIGH (ref 0.350–4.500)

## 2023-03-27 MED ORDER — FUROSEMIDE 20 MG PO TABS
20.0000 mg | ORAL_TABLET | Freq: Every day | ORAL | Status: DC
  Administered 2023-03-27 – 2023-03-28 (×2): 20 mg via ORAL
  Filled 2023-03-27 (×2): qty 1

## 2023-03-27 MED ORDER — ONDANSETRON HCL 4 MG/2ML IJ SOLN: 4.0000 mg | Freq: Four times a day (QID) | INTRAMUSCULAR | Status: DC | PRN

## 2023-03-27 MED ORDER — ONDANSETRON HCL 4 MG PO TABS: 4.0000 mg | ORAL_TABLET | Freq: Four times a day (QID) | ORAL | Status: DC | PRN

## 2023-03-27 MED ORDER — ACETAMINOPHEN 325 MG PO TABS
650.0000 mg | ORAL_TABLET | Freq: Four times a day (QID) | ORAL | Status: DC | PRN
  Administered 2023-03-27: 650 mg via ORAL
  Filled 2023-03-27: qty 2

## 2023-03-27 MED ORDER — HYDROCODONE-ACETAMINOPHEN 5-325 MG PO TABS: 1.0000 | ORAL_TABLET | ORAL | Status: DC | PRN

## 2023-03-27 MED ORDER — TRAZODONE HCL 50 MG PO TABS
25.0000 mg | ORAL_TABLET | Freq: Every evening | ORAL | Status: DC | PRN
  Administered 2023-03-27: 25 mg via ORAL
  Filled 2023-03-27: qty 1

## 2023-03-27 MED ORDER — ACETAMINOPHEN 650 MG RE SUPP: 650.0000 mg | Freq: Four times a day (QID) | RECTAL | Status: DC | PRN

## 2023-03-27 MED ORDER — FUROSEMIDE 10 MG/ML IJ SOLN
40.0000 mg | Freq: Once | INTRAMUSCULAR | Status: AC
  Administered 2023-03-27: 40 mg via INTRAVENOUS
  Filled 2023-03-27: qty 4

## 2023-03-27 MED ORDER — LOSARTAN POTASSIUM 25 MG PO TABS
25.0000 mg | ORAL_TABLET | Freq: Every day | ORAL | Status: DC
  Administered 2023-03-27 – 2023-03-28 (×2): 25 mg via ORAL
  Filled 2023-03-27 (×2): qty 1

## 2023-03-27 MED ORDER — AMITRIPTYLINE HCL 50 MG PO TABS
50.0000 mg | ORAL_TABLET | Freq: Every day | ORAL | Status: DC
  Administered 2023-03-27: 50 mg via ORAL
  Filled 2023-03-27: qty 1

## 2023-03-27 MED ORDER — ALBUTEROL SULFATE (2.5 MG/3ML) 0.083% IN NEBU: 2.5000 mg | INHALATION_SOLUTION | RESPIRATORY_TRACT | Status: DC | PRN

## 2023-03-27 MED ORDER — IOHEXOL 350 MG/ML SOLN
60.0000 mL | Freq: Once | INTRAVENOUS | Status: AC | PRN
  Administered 2023-03-27: 60 mL via INTRAVENOUS

## 2023-03-27 MED ORDER — PREGABALIN 50 MG PO CAPS
100.0000 mg | ORAL_CAPSULE | Freq: Two times a day (BID) | ORAL | Status: DC
  Administered 2023-03-27 – 2023-03-28 (×3): 100 mg via ORAL
  Filled 2023-03-27 (×3): qty 2

## 2023-03-27 MED ORDER — ENOXAPARIN SODIUM 60 MG/0.6ML IJ SOSY
60.0000 mg | PREFILLED_SYRINGE | INTRAMUSCULAR | Status: DC
  Administered 2023-03-27 – 2023-03-28 (×2): 60 mg via SUBCUTANEOUS
  Filled 2023-03-27 (×2): qty 0.6

## 2023-03-27 MED ORDER — IPRATROPIUM-ALBUTEROL 0.5-2.5 (3) MG/3ML IN SOLN
3.0000 mL | Freq: Once | RESPIRATORY_TRACT | Status: AC
  Administered 2023-03-27: 3 mL via RESPIRATORY_TRACT
  Filled 2023-03-27: qty 3

## 2023-03-27 MED ORDER — MELATONIN 5 MG PO TABS: 5.0000 mg | ORAL_TABLET | Freq: Every evening | ORAL | Status: DC | PRN

## 2023-03-27 NOTE — Assessment & Plan Note (Signed)
Not acutely flared 

## 2023-03-27 NOTE — H&P (Addendum)
History and Physical    Patient: Breanna Lawson OVF:[id number] DOB: [birth year] DOA: 03/26/2023 DOS: the patient was seen and examined on 03/27/2023 PCP: Patrice Paradise, MD  Patient coming from: Home  Chief Complaint:  Chief Complaint  Patient presents with   Fall         HPI: DONAE KUEKER is a 77 y.o. female with medical history significant for HTN, CKD 3 A, gout, neuropathy, morbid obesity and frequent falls more recently in the past couple months, who was brought to the ED by EMS following a fall.  Patient uses a walker and wheelchair at baseline initially refused transport, however PT went her home today and noticed O2 to be dropping to the 80s when ambulating.  History is given by the son and daughter-in-law at the bedside who stated that except for falls, patient was at her baseline.  She has had no recent illness.  They did note that she sometimes seems a bit short of breath and sometimes has swelling in her feet but did not otherwise notice anything different.  She has not had any recent illness, no cough, fever or chills, abdominal pain, vomiting or diarrhea.  She denies chest pain.  Her main complaint is pain in her feet from her neuropathy for which she recently saw her neurologist on 6/18 and her Lyrica and nortriptyline were uptitrated per review of the note.  Patient arrived on 2 L O2 ED course and data review: Vitals within normal limits with O2 sat in the mid 80s on 2 L.  ABG showed pH of 7.3 with pCO2 of 76.  Troponin 7 and BNP 23.4.  CBC WNL and procalcitonin less than 0.10.  Creatinine at baseline at 1.45. EKG, Personally viewed and interpreted showed A-fib at 76 CTA chest negative for PE but suggested pulmonary hypertension and tracheobronchial malacia as follows: IMPRESSION: 1. No pulmonary embolus. 2. Cardiomegaly. Dilated main pulmonary artery suggesting pulmonary arterial hypertension. 3. 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 Mild central  bronchial thickening. 4. Nodular hepatic contours suggestive of cirrhosis. Recommend correlation for cirrhosis risk factors.  Patient was treated with IV Lasix, DuoNeb and placed on BiPAP Hospitalist consulted for admission.     Past Medical History:  Diagnosis Date   Arthritis    Chronic kidney disease    GERD (gastroesophageal reflux disease)    Hypertension    Past Surgical History:  Procedure Laterality Date   ANKLE SURGERY Left    CHOLECYSTECTOMY     COLONOSCOPY     more than 10 years ago.    DILATION AND CURETTAGE OF UTERUS     EYE SURGERY     INSERTION OF MESH N/A 03/25/2015   Procedure: INSERTION OF MESH;  Surgeon: Kieth Brightly, MD;  Location: [facility] ORS;  Service: General;  Laterality: N/A;   VENTRAL HERNIA REPAIR N/A 03/25/2015   Procedure: HERNIA REPAIR VENTRAL ADULT;  Surgeon: Kieth Brightly, MD;  Location: [facility] ORS;  Service: General;  Laterality: N/A;   Social History:  reports that she has never smoked. She has never used smokeless tobacco. She reports that she does not drink alcohol and does not use drugs.  No Known Allergies  Family History  Problem Relation Age of Onset   Heart attack Mother    Heart failure Father    Breast cancer Sister     Prior to Admission medications   Medication Sig Start Date End Date Taking? Authorizing Provider  albuterol (PROVENTIL HFA) 108 (90 Base) MCG/ACT  inhaler Inhale into the lungs. 01/12/17 01/12/18  [provider]  allopurinol (ZYLOPRIM) 100 MG tablet  07/17/17   [provider]  amLODipine (NORVASC) 5 MG tablet Take by mouth. 02/02/19   [provider]  aspirin 81 MG tablet Take 81 mg by mouth daily.    [provider]  calcium-vitamin D 250-100 MG-UNIT per tablet Take 1 tablet by mouth daily.     [provider]  colchicine 0.6 MG tablet Take by mouth. 04/12/17   [provider]  furosemide (LASIX) 20 MG tablet Take by mouth. 11/26/18   [provider]  indomethacin (INDOCIN) 25 MG capsule Take by mouth.    [provider]  ketorolac (ACULAR) 0.5 % ophthalmic solution  12/21/19   [provider]  lisinopril-hydrochlorothiazide (PRINZIDE,ZESTORETIC) 20-25 MG per tablet Take 1 tablet by mouth daily.    [provider]  losartan (COZAAR) 100 MG tablet  10/09/17   [provider]  Multiple Vitamin (MULTI-VITAMINS) TABS Take by mouth.    [provider]  pantoprazole (PROTONIX) 40 MG tablet TAKE 1 TABLET BY MOUTH ONCE DAILY 09/02/19   [provider]  predniSONE (DELTASONE) 20 MG tablet  09/20/17   [provider]  ranitidine (ZANTAC) 300 MG tablet Take 300 mg by mouth daily.     [provider]  traMADol Janean Sark) 50 MG tablet  09/20/17   [provider]    Physical Exam: Vitals:   03/27/23 0030 03/27/23 0230 03/27/23 0315 03/27/23 0330  BP: 136/65 (!) 144/82  133/71  Pulse: 67 73  68  Resp: 20 19  18   Temp:  98.2 F (36.8 C)    TempSrc:  Oral    SpO2: 100% 95% 99% 100%   Physical Exam Vitals and nursing note reviewed.  Constitutional:      Appearance: She is morbidly obese.     Comments: On BiPAP  HENT:     Head: Normocephalic and atraumatic.  Cardiovascular:     Rate and Rhythm: Normal rate and regular rhythm.     Heart sounds: Normal heart sounds.  Pulmonary:     Effort: Pulmonary effort is normal.     Breath sounds: Normal breath sounds.  Abdominal:     Palpations: Abdomen is soft.     Tenderness: There is no abdominal tenderness.  Neurological:     Mental Status: Mental status is at baseline.     Labs on Admission: I have personally reviewed following labs and imaging studies  CBC: Recent Labs  Lab 03/26/23 1704  WBC 10.1  NEUTROABS 8.1*  HGB 12.0  HCT 39.9  MCV 86.0  PLT 267   Basic Metabolic Panel: Recent Labs  Lab 03/26/23 1704  NA 134*  K 4.6  CL 95*  CO2 29  GLUCOSE 107*  BUN 25*  CREATININE 1.45*   CALCIUM 9.1   GFR: CrCl cannot be calculated (Unknown ideal weight.). Liver Function Tests: No results for input(s): "AST", "ALT", "ALKPHOS", "BILITOT", "PROT", "ALBUMIN" in the last 168 hours. No results for input(s): "LIPASE", "AMYLASE" in the last 168 hours. No results for input(s): "AMMONIA" in the last 168 hours. Coagulation Profile: No results for input(s): "INR", "PROTIME" in the last 168 hours. Cardiac Enzymes: No results for input(s): "CKTOTAL", "CKMB", "CKMBINDEX", "TROPONINI" in the last 168 hours. BNP (last 3 results) No results for input(s): "PROBNP" in the last 8760 hours. HbA1C: No results for input(s): "HGBA1C" in the last 72 hours. CBG: No results for input(s): "  GLUCAP" in the last 168 hours. Lipid Profile: No results for input(s): "CHOL", "HDL", "LDLCALC", "TRIG", "CHOLHDL", "LDLDIRECT" in the last 72 hours. Thyroid Function Tests: No results for input(s): "TSH", "T4TOTAL", "FREET4", "T3FREE", "THYROIDAB" in the last 72 hours. Anemia Panel: No results for input(s): "VITAMINB12", "FOLATE", "FERRITIN", "TIBC", "IRON", "RETICCTPCT" in the last 72 hours. Urine analysis: No results found for: "COLORURINE", "APPEARANCEUR", "LABSPEC", "PHURINE", "GLUCOSEU", "HGBUR", "BILIRUBINUR", "KETONESUR", "PROTEINUR", "UROBILINOGEN", "NITRITE", "LEUKOCYTESUR"  Radiological Exams on Admission: CT Angio Chest PE W and/or Wo Contrast  Result Date: 03/27/2023 CLINICAL DATA:  Pulmonary embolism (PE) suspected, high prob Fall at home. EXAM: CT ANGIOGRAPHY CHEST WITH CONTRAST TECHNIQUE: Multidetector CT imaging of the chest was performed using the standard protocol during bolus administration of intravenous contrast. Multiplanar CT image reconstructions and MIPs were obtained to evaluate the vascular anatomy. RADIATION DOSE REDUCTION: This exam was performed according to the departmental dose-optimization program which includes automated exposure control, adjustment of the mA and/or kV  according to patient size and/or use of iterative reconstruction technique. CONTRAST:  60mL OMNIPAQUE IOHEXOL 350 MG/ML SOLN COMPARISON:  Radiograph earlier today. FINDINGS: Cardiovascular: There are no filling defects within the pulmonary arteries to suggest pulmonary embolus. Main pulmonary artery is dilated at 3.1 cm. The heart is enlarged. Aortic atherosclerosis. No acute aortic findings. No pericardial effusion. Mediastinum/Nodes: No adenopathy or mass. Decompressed esophagus. No suspicious thyroid nodule. Lungs/Pleura: Subsegmental atelectasis in the lower lobes. 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 There is mild central bronchial thickening. In no focal airspace disease. No pleural effusion. Upper Abdomen: Enlargement of the liver and spleen. Nodular hepatic contours suggestive of cirrhosis. Cholecystectomy. Musculoskeletal: Remote right rib fractures with incomplete surrounding callus formation. Healed left rib fractures. Diffuse thoracic degenerative change. Scattered calcified Schmorl's nodes. No acute osseous abnormalities are seen. Review of the MIP images confirms the above findings. IMPRESSION: 1. No pulmonary embolus. 2. Cardiomegaly. Dilated main pulmonary artery suggesting pulmonary arterial hypertension. 3. 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 Mild central bronchial thickening. 4. Nodular hepatic contours suggestive of cirrhosis. Recommend correlation for cirrhosis risk factors. Electronically Signed   By: Narda Rutherford M.D.   On: 03/27/2023 03:03   DG Chest 2 View  Result Date: 03/27/2023 CLINICAL DATA:  Status post fall. EXAM: CHEST - 2 VIEW COMPARISON:  September 14, 2011 FINDINGS: There is mild to moderate severity enlargement of the cardiac silhouette. Mild, diffusely increased interstitial lung markings are seen. Mild right infrahilar atelectasis is noted. There is no evidence of focal consolidation, pleural effusion or pneumothorax. Multilevel  degenerative changes are seen throughout the thoracic spine. IMPRESSION: Cardiomegaly with mild interstitial edema and mild right infrahilar atelectasis. Electronically Signed   By: Aram Candela M.D.   On: 03/27/2023 00:41     Data Reviewed: Relevant notes from primary care and specialist visits, past discharge summaries as available in EHR, including Care Everywhere. 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 -INCREASE nortriptyline to 10 mg in the morning and 50 mg at night. Fill with a pill pack.  -Continue taking Tramadol 50 mg daily. Could take an extra 50 mg as needed for pain at night"  Stage 3b chronic kidney disease (HCC) Renal function at baseline  Gout Not acutely flared  Obesity, Class III, BMI 40-49.9 (morbid obesity) (HCC) Complicating factor to overall prognosis and care  Hypertension Continue home losartan continue daily Lasix     DVT prophylaxis: Lovenox  Consults: none  Advance Care Planning:   Code Status: Prior   Family Communication: Son and daughter-in-law at bedside  Disposition Plan: Back to previous home environment  Severity of Illness: The appropriate patient status for this patient is INPATIENT. Inpatient status is judged to be reasonable and necessary in order to provide the required intensity of service to ensure the patient's safety. The patient's presenting symptoms, physical exam findings, and initial radiographic and laboratory data in the context of their chronic comorbidities is felt to place them at high risk for further clinical deterioration. Furthermore, it is not anticipated that the patient will be medically stable for discharge from the hospital within 2 midnights of admission.   * I certify that at the point of admission it is my clinical judgment that the patient will require inpatient hospital care spanning beyond 2 midnights from the point of admission due to high intensity of service, high risk for further deterioration and high frequency of surveillance required.*  Author: Andris Baumann, MD 03/27/2023 5:34 AM  For on call review [email or web address].

## 2023-03-27 NOTE — Assessment & Plan Note (Signed)
Complicating factor to overall prognosis and care 

## 2023-03-27 NOTE — Progress Notes (Signed)
PHARMACIST - PHYSICIAN COMMUNICATION  CONCERNING:  Enoxaparin (Lovenox) for DVT Prophylaxis    RECOMMENDATION: Patient was prescribed enoxaprin 40mg  q24 hours for VTE prophylaxis.   Filed Weights   03/27/23 0626  Weight: 127.2 kg (280 lb 6.8 oz)    Body mass index is 51.29 kg/m.  Estimated Creatinine Clearance: 43.5 mL/min (A) (by C-G formula based on SCr of 1.45 mg/dL (H)).   Based on [facility] policy patient is candidate for enoxaparin 0.5mg /kg TBW SQ every 24 hours based on BMI being >30.   DESCRIPTION: Pharmacy has adjusted enoxaparin dose per [facility] policy.  Patient is now receiving enoxaparin 60 mg every 24 hours    Barrie Folk, PharmD Clinical Pharmacist  03/27/2023 6:29 AM

## 2023-03-27 NOTE — Progress Notes (Signed)
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 CTA chest neg for PE. Echo ordered   Pulmonary hypertension: on CT. Echo ordered. Continue w/ supportive care   Frequent falls: PT/OT consulted    Neuropathy: continue on amitriptyline, pregabalin    CKDIIIb: Cr is at baseline. Will continue to monitor    Gout: w/o acute flare   Morbid obesity: BMI 50.2. Complicates overall care & prognosis    HTN: continue on home dose of losartan, lasix       DVT prophylaxis:  lovenox  Code Status: full  Family Communication: Disposition Plan: depends on PT/OT recs   Level of care: Progressive  Status is: Inpatient Remains inpatient appropriate because: severity of illness    Consultants:    Procedures:   Antimicrobials:    Subjective: Pt c/o shortness of breath   Objective: Vitals:   03/27/23 0600 03/27/23 0626 03/27/23 0730 03/27/23 0830  BP: 138/77  (!) 141/82 138/62  Pulse: 68  69 64  Resp: 18  18 18   Temp:  98.3 F (36.8 C)    TempSrc:  Oral    SpO2: 99%  97% 98%  Weight:  127.2 kg    Height:  5\' 2"  (1.575 m)      Intake/Output Summary (Last 24 hours) at 03/27/2023 0911 Last data filed at 03/27/2023 0700 Gross per 24 hour  Intake --  Output 1000 ml  Net -1000 ml   Filed Weights   03/27/23 0626  Weight: 127.2 kg    Examination:  General exam: Appears calm  and comfortable  Respiratory system: diminished breath sounds b/l Cardiovascular system: S1 & S2+. No rubs, gallops or clicks. Gastrointestinal system: Abdomen is obese, soft and nontender. Normal bowel sounds heard. Central nervous system: Alert and awake. Moves all extremities  Psychiatry: Judgement and insight appear normal. Flat mood and affect.     Data Reviewed: I have personally reviewed following labs and imaging studies  CBC: Recent Labs  Lab 03/26/23 1704 03/27/23 0659  WBC 10.1 9.3  NEUTROABS 8.1*  --   HGB 12.0 11.6*  HCT 39.9 39.5  MCV 86.0 87.0  PLT 267 262   Basic Metabolic Panel: Recent Labs  Lab 03/26/23 1704 03/27/23 0659  NA 134*  --   K 4.6  --   CL 95*  --   CO2 29  --   GLUCOSE 107*  --   BUN 25*  --   CREATININE 1.45* 1.43*  CALCIUM 9.1  --    GFR: Estimated Creatinine Clearance: 44.1 mL/min (A) (by C-G formula based on SCr of 1.43 mg/dL (H)). Liver Function Tests: No results for input(s): "AST", "ALT", "ALKPHOS", "BILITOT", "PROT", "ALBUMIN" in the last 168 hours. No results for input(s): "LIPASE", "AMYLASE" in the last 168 hours. No results for input(s): "AMMONIA" in the last 168 hours. Coagulation Profile: No results for input(s): "INR", "PROTIME" in the  last 168 hours. Cardiac Enzymes: No results for input(s): "CKTOTAL", "CKMB", "CKMBINDEX", "TROPONINI" in the last 168 hours. BNP (last 3 results) No results for input(s): "PROBNP" in the last 8760 hours. HbA1C: No results for input(s): "HGBA1C" in the last 72 hours. CBG: No results for input(s): "GLUCAP" in the last 168 hours. Lipid Profile: No results for input(s): "CHOL", "HDL", "LDLCALC", "TRIG", "CHOLHDL", "LDLDIRECT" in the last 72 hours. Thyroid Function Tests: Recent Labs    03/27/23 0659  TSH 4.563*   Anemia Panel: No results for input(s): "VITAMINB12", "FOLATE", "FERRITIN", "TIBC", "IRON", "RETICCTPCT" in the last 72 hours. Sepsis Labs: Recent Labs  Lab 03/26/23 2345   PROCALCITON <0.10    Recent Results (from the past 240 hour(s))  SARS Coronavirus 2 by RT PCR (hospital order, performed in [facility] hospital lab) *cepheid single result test* Anterior Nasal Swab     Status: None   Collection Time: 03/26/23 11:48 PM   Specimen: Anterior Nasal Swab  Result Value Ref Range Status   SARS Coronavirus 2 by RT PCR NEGATIVE NEGATIVE Final    Comment: (NOTE) SARS-CoV-2 target nucleic acids are NOT DETECTED.  The SARS-CoV-2 RNA is generally detectable in upper and lower respiratory specimens during the acute phase of infection. The lowest concentration of SARS-CoV-2 viral copies this assay can detect is 250 copies / mL. A negative result does not preclude SARS-CoV-2 infection and should not be used as the sole basis for treatment or other patient management decisions.  A negative result may occur with improper specimen collection / handling, submission of specimen other than nasopharyngeal swab, presence of viral mutation(s) within the areas targeted by this assay, and inadequate number of viral copies (<250 copies / mL). A negative result must be combined with clinical observations, patient history, and epidemiological information.  Fact Sheet for Patients:   RoadLapTop.co.za  Fact Sheet for Healthcare Providers: http://kim-miller.com/  This test is not yet approved or  cleared by the [place] FDA and has been authorized for detection and/or diagnosis of SARS-CoV-2 by FDA under an Emergency Use Authorization (EUA).  This EUA will remain in effect (meaning this test can be used) for the duration of the COVID-19 declaration under Section 564(b)(1) of the Act, 21 U.S.C. section 360bbb-3(b)(1), unless the authorization is terminated or revoked sooner.  Performed at [facility], [address] [place], [place] [address]          Radiology Studies: CT Angio Chest PE W and/or Wo  Contrast  Result Date: 03/27/2023 CLINICAL DATA:  Pulmonary embolism (PE) suspected, high prob Fall at home. EXAM: CT ANGIOGRAPHY CHEST WITH CONTRAST TECHNIQUE: Multidetector CT imaging of the chest was performed using the standard protocol during bolus administration of intravenous contrast. Multiplanar CT image reconstructions and MIPs were obtained to evaluate the vascular anatomy. RADIATION DOSE REDUCTION: This exam was performed according to the departmental dose-optimization program which includes automated exposure control, adjustment of the mA and/or kV according to patient size and/or use of iterative reconstruction technique. CONTRAST:  60mL OMNIPAQUE IOHEXOL 350 MG/ML SOLN COMPARISON:  Radiograph earlier today. FINDINGS: Cardiovascular: There are no filling defects within the pulmonary arteries to suggest pulmonary embolus. Main pulmonary artery is dilated at 3.1 cm. The heart is enlarged. Aortic atherosclerosis. No acute aortic findings. No pericardial effusion. Mediastinum/Nodes: No adenopathy or mass. Decompressed esophagus. No suspicious thyroid nodule. Lungs/Pleura: Subsegmental atelectasis in the lower lobes. 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 There is mild central bronchial thickening. In no focal airspace disease. No  pleural effusion. Upper Abdomen: Enlargement of the liver and spleen. Nodular hepatic contours suggestive of cirrhosis. Cholecystectomy. Musculoskeletal: Remote right rib fractures with incomplete surrounding callus formation. Healed left rib fractures. Diffuse thoracic degenerative change. Scattered calcified Schmorl's nodes. No acute osseous abnormalities are seen. Review of the MIP images confirms the above findings. IMPRESSION: 1. No pulmonary embolus. 2. Cardiomegaly. Dilated main pulmonary artery suggesting pulmonary arterial hypertension. 3. 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 Mild central bronchial thickening. 4. Nodular hepatic  contours suggestive of cirrhosis. Recommend correlation for cirrhosis risk factors. Electronically Signed   By: Narda Rutherford M.D.   On: 03/27/2023 03:03   DG Chest 2 View  Result Date: 03/27/2023 CLINICAL DATA:  Status post fall. EXAM: CHEST - 2 VIEW COMPARISON:  September 14, 2011 FINDINGS: There is mild to moderate severity enlargement of the cardiac silhouette. Mild, diffusely increased interstitial lung markings are seen. Mild right infrahilar atelectasis is noted. There is no evidence of focal consolidation, pleural effusion or pneumothorax. Multilevel degenerative changes are seen throughout the thoracic spine. IMPRESSION: Cardiomegaly with mild interstitial edema and mild right infrahilar atelectasis. Electronically Signed   By: Aram Candela M.D.   On: 03/27/2023 00:41        Scheduled Meds:  amitriptyline  50 mg Oral QHS   enoxaparin (LOVENOX) injection  60 mg Subcutaneous Q24H   furosemide  20 mg Oral Daily   losartan  25 mg Oral Daily   pregabalin  100 mg Oral BID   Continuous Infusions:   LOS: 0 days    Time spent: 35 mins     Charise Killian, MD [facility] Pager [phone]  If 7PM-7AM, please contact night-coverage [email or web address] 03/27/2023, 9:11 AM '

## 2023-03-27 NOTE — Assessment & Plan Note (Addendum)
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  Will not continue at this time Echocardiogram Treat each condition as outlined on the each problem

## 2023-03-27 NOTE — ED Notes (Signed)
Patient ate approx 50% of breakfast tray. 

## 2023-03-27 NOTE — Assessment & Plan Note (Addendum)
Likely related to chronic physical deconditioning, neuropathy as well as medications to treat neuropathy At baseline ambulates with a walker Will cautiously continue psychoactive meds as patient is at baseline mentation PT eval

## 2023-03-27 NOTE — ED Notes (Signed)
Pt needed to use the restroom, due to being high fall risk writer had tried bedpan but was unsuccessful. Writer tried bedside commode with permission from the RN. Pt had urinated less than 50 mL. Pt cleaned and placed back into bed.

## 2023-03-27 NOTE — Assessment & Plan Note (Addendum)
Tracheobronchial malacia on CT Possible OSA/OHS Get echocardiogram Consider pulmonology consult

## 2023-03-27 NOTE — Assessment & Plan Note (Addendum)
Frequent falls in the setting of sedating psychoactive medication Follows with neurology with recent dose increase on 03/19/2023 "Patient is taking Lyrica 200 mg twice a day, Nortriptyline 50 mg at night, Tramadol 50 mg daily. -INCREASE nortriptyline to 10 mg in the morning and 50 mg at night. Fill with a pill pack.  -Continue taking Tramadol 50 mg daily. Could take an extra 50 mg as needed for pain at night"

## 2023-03-27 NOTE — Assessment & Plan Note (Signed)
Renal function at baseline 

## 2023-03-27 NOTE — Assessment & Plan Note (Signed)
Continue home losartan continue daily Lasix

## 2023-03-28 ENCOUNTER — Inpatient Hospital Stay (HOSPITAL_COMMUNITY)
Admit: 2023-03-28 | Discharge: 2023-03-28 | Disposition: A | Discharge status: Home / Self Care | Payer: Medicare HMO | Attending: Internal Medicine | Admitting: Internal Medicine

## 2023-03-28 DIAGNOSIS — R55 Syncope and collapse: Secondary | ICD-10-CM | POA: Diagnosis not present

## 2023-03-28 DIAGNOSIS — J9621 Acute and chronic respiratory failure with hypoxia: Secondary | ICD-10-CM | POA: Diagnosis not present

## 2023-03-28 DIAGNOSIS — J9622 Acute and chronic respiratory failure with hypercapnia: Secondary | ICD-10-CM | POA: Diagnosis not present

## 2023-03-28 LAB — ECHOCARDIOGRAM COMPLETE
AR max vel: 1.9 cm2
AV Area VTI: 2.5 cm2
AV Area mean vel: 2.02 cm2
AV Mean grad: 6 mmHg
AV Peak grad: 14.4 mmHg
Ao pk vel: 1.9 m/s
Area-P 1/2: 2.97 cm2
Height: 62 in
MV VTI: 2.04 cm2
S' Lateral: 2.8 cm
Weight: 4486.8 oz

## 2023-03-28 LAB — CBC
HCT: 39 % (ref 36.0–46.0)
Hemoglobin: 11.6 g/dL — ABNORMAL LOW (ref 12.0–15.0)
MCH: 25.8 pg — ABNORMAL LOW (ref 26.0–34.0)
MCHC: 29.7 g/dL — ABNORMAL LOW (ref 30.0–36.0)
MCV: 86.7 fL (ref 80.0–100.0)
Platelets: 256 10*3/uL (ref 150–400)
RBC: 4.5 MIL/uL (ref 3.87–5.11)
RDW: 16.3 % — ABNORMAL HIGH (ref 11.5–15.5)
WBC: 7.9 10*3/uL (ref 4.0–10.5)
nRBC: 0 % (ref 0.0–0.2)

## 2023-03-28 LAB — BASIC METABOLIC PANEL
Anion gap: 6 (ref 5–15)
BUN: 24 mg/dL — ABNORMAL HIGH (ref 8–23)
CO2: 36 mmol/L — ABNORMAL HIGH (ref 22–32)
Calcium: 9 mg/dL (ref 8.9–10.3)
Chloride: 95 mmol/L — ABNORMAL LOW (ref 98–111)
Creatinine, Ser: 1.45 mg/dL — ABNORMAL HIGH (ref 0.44–1.00)
GFR, Estimated: 38 mL/min — ABNORMAL LOW (ref >60)
Glucose, Bld: 99 mg/dL (ref 70–99)
Potassium: 4.2 mmol/L (ref 3.5–5.1)
Sodium: 137 mmol/L (ref 135–145)

## 2023-03-28 NOTE — Evaluation (Signed)
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 Pt is alert and oriented x4, slightly increased time required for processing.  PTA pt lives alone, has a Network engineer  and a daughter that will assist with ADL/IADL. Pt has a fall history and amb with rollator. Pt presents with deficits in strength, endurance, activity tolerance, balance affecting safe and optimal ADL completion. Pt amb to bathroom with RW with supervision/CGA. Pt requires assist for toileting for thoroughness. Pt is performing ADL slightly below functional baseline, would benefit from acute and post acute OT to address deficits.      Recommendations for follow up therapy are one component of a multi-disciplinary discharge planning process, led by the attending physician.  Recommendations may be updated based on patient status, additional functional criteria and insurance authorization.   Assistance Recommended at Discharge Intermittent Supervision/Assistance  Patient can return home with the following A little help with walking and/or transfers;A little help with bathing/dressing/bathroom;Assistance with cooking/housework    Functional Status Assessment  Patient has had a recent decline in their functional status and demonstrates the ability to make significant improvements in function in a reasonable and predictable amount of time.  Equipment Recommendations  BSC/3in1    Recommendations for Other Services       Precautions / Restrictions  Precautions Precautions: Fall Restrictions Weight Bearing Restrictions: No      Mobility Bed Mobility Overal bed mobility: Needs Assistance Bed Mobility: Supine to Sit     Supine to sit: Supervision, HOB elevated          Transfers Overall transfer level: Needs assistance Equipment used: Rolling walker (2 wheels) Transfers: Sit to/from Stand Sit to Stand: Min guard                  Balance Overall balance assessment: Needs assistance Sitting-balance support: Feet supported, No upper extremity supported Sitting balance-Leahy Scale: Good     Standing balance support: Bilateral upper extremity supported, During functional activity, Reliant on assistive device for balance Standing balance-Leahy Scale: Fair                             ADL either performed or assessed with clinical judgement   ADL Overall ADL's : Needs assistance/impaired Eating/Feeding: Set up;Sitting   Grooming: Wash/dry hands;Wash/dry face;Standing;Min guard           Upper Body Dressing : Minimal assistance   Lower Body Dressing: Maximal assistance Lower Body Dressing Details (indicate cue type and reason): socks Toilet Transfer: Supervision/safety;Min guard;Rolling walker (2 wheels);BSC/3in1;Ambulation   Toileting- Clothing Manipulation and Hygiene: Minimal assistance Toileting - Clothing Manipulation Details (indicate cue type and reason): for thoroughness     Functional mobility during ADLs: Supervision/safety;Min guard;Rolling walker (2 wheels) (approx 20' in room 2x with RW)       Vision Patient Visual Report: No change from baseline       Perception     Praxis      Pertinent Vitals/Pain Pain Assessment Pain Assessment: No/denies pain  Hand Dominance     Extremity/Trunk Assessment Upper Extremity Assessment Upper Extremity Assessment: Overall WFL for tasks assessed   Lower Extremity Assessment Lower Extremity Assessment: Generalized weakness        Communication Communication Communication: No difficulties   Cognition Arousal/Alertness: Awake/alert Behavior During Therapy: WFL for tasks assessed/performed Overall Cognitive Status: No family/caregiver present to determine baseline cognitive functioning Area of Impairment: Problem solving                             Problem Solving: Slow processing       General Comments  spo2 >90% on RA throughout    Exercises Other Exercises Other Exercises: edu re: role of OT, role of rehab, safe ADL completion with DME   Shoulder Instructions      Home Living Family/patient expects to be discharged to:: Private residence Living Arrangements: Alone Available Help at Discharge: Friend(s);Neighbor;Available PRN/intermittently Type of Home: Apartment Home Access: Level entry     Home Layout: One level     Bathroom Shower/Tub: Chief Strategy Officer: Standard Bathroom Accessibility: Yes   Home Equipment: Rollator (4 wheels);Wheelchair - manual;Grab bars - tub/shower;Tub bench          Prior Functioning/Environment Prior Level of Function : Independent/Modified Independent             Mobility Comments: amb with rollator household distances; ADLs Comments: feeding with MOD I however neighbor will come over to meal prep, grooming with MOD I, wears slip on shoes, neighbor comes over while she showers; assist for IADLs        OT Problem List: Decreased strength;Decreased activity tolerance;Decreased knowledge of use of DME or AE      OT Treatment/Interventions: Self-care/ADL training;DME and/or AE instruction;Therapeutic activities;Balance training;Therapeutic exercise;Energy conservation;Patient/family education    OT Goals(Current goals can be found in the care plan section) Acute Rehab OT Goals Patient Stated Goal: get stronger OT Goal Formulation: With patient Time For Goal Achievement: 04/11/23 Potential to Achieve Goals: Good ADL  Goals Pt Will Perform Grooming: with modified independence;standing;sitting Pt Will Perform Lower Body Dressing: with modified independence;sitting/lateral leans Pt Will Transfer to Toilet: with modified independence;ambulating Pt Will Perform Toileting - Clothing Manipulation and hygiene: with modified independence;sitting/lateral leans;sit to/from stand  OT Frequency: Min 2X/week    Co-evaluation              AM-PAC OT "6 Clicks" Daily Activity     Outcome Measure Help from another person eating meals?: None Help from another person taking care of personal grooming?: None Help from another person toileting, which includes using toliet, bedpan, or urinal?: A Little Help from another person bathing (including washing, rinsing, drying)?: A Little Help from another person to put on and taking off regular upper body clothing?: A Little Help from another person to put on and taking off regular lower body clothing?: A Lot 6 Click Score: 19   End of Session Equipment Utilized During Treatment: Rolling walker (2 wheels) Nurse Communication:  (NT re status)  Activity Tolerance: Patient tolerated treatment well Patient left: in chair;with call bell/phone within reach;with chair alarm set  OT Visit Diagnosis: Muscle weakness (generalized) (M62.81);Other abnormalities of gait and mobility (R26.89)                Time: 8295-6213 OT Time Calculation (min): 22 min Charges:  OT General Charges $OT Visit: 1 Visit OT Evaluation $OT Eval Moderate Complexity: 1 Mod  Oleta Mouse, OTD OTR/L  03/28/23, 12:55 PM

## 2023-03-28 NOTE — TOC Transition Note (Signed)
Transition of Care [facility]) - CM/SW Discharge Note   Patient Details  Name: Breanna Lawson MRN: [id number] Date of Birth: [birth year]  Transition of Care [facility]) CM/SW Contact:  Margarito Liner, LCSW Phone Number: 03/28/2023, 2:36 PM   Clinical Narrative:   Patient has orders to discharge home today. CSW met with patient. Son at bedside. CSW introduced role and explained that therapy recommendations would be discussed. Patient is already active with [facility] for OT, RN, aide, SW. They can add PT. Patient has a RW and rollator at home. She is agreeable to 3-in-1. CSW ordered through [employer]. Patient and son are considering long-term placement in the future but said she has to complete spend down process. They do have the Medicaid application at home to fill out. Encouraged them to discuss with home heatlh social worker. No further concerns. CSW signing off.  Final next level of care: Home w Home Health Services Barriers to Discharge: No Barriers Identified   Patient Goals and CMS Choice      Discharge Placement                  Patient to be transferred to facility by: Son Name of family member notified: Ki Luckman Patient and family notified of of transfer: 03/28/23  Discharge Plan and Services Additional resources added to the After Visit Summary for                  DME Arranged: 3-N-1 DME Agency: [employer] Date DME Agency Contacted: 03/28/23   Representative spoke with at DME Agency: Yvone Neu HH Arranged: RN, PT, OT, Nurse's Aide, Social Work [employer] Agency: [facility] Date [facility] Agency Contacted: 03/28/23   Representative spoke with at [facility] Agency: Gearldine Bienenstock  Social Determinants of Health (SDOH) Interventions SDOH Screenings   Food Insecurity: No Food Insecurity (03/27/2023)  Housing: Low Risk  (03/27/2023)  Transportation Needs: No Transportation Needs (03/27/2023)  Utilities: Not At Risk (03/27/2023)  Tobacco Use: Low Risk  (03/26/2023)      Readmission Risk Interventions     No data to display

## 2023-03-28 NOTE — Progress Notes (Signed)
*  PRELIMINARY RESULTS* Echocardiogram 2D Echocardiogram has been performed.  Carolyne Fiscal 03/28/2023, 11:17 AM

## 2023-03-28 NOTE — Discharge Summary (Addendum)
Physician Discharge Summary  Breanna Lawson EPP:[id number] DOB: 1946-09-12 DOA: 03/26/2023  PCP: Patrice Paradise, MD  Admit date: 03/26/2023 Discharge date: 03/28/2023  Admitted From: home  Disposition: home w/ home health   Recommendations for Outpatient Follow-up:  Follow up with PCP in 1-2 weeks F/u w/ pulmon, Dr. Belia Heman, in 1-2 weeks F/u w/ cardio in 1-2 weeks   Home Health: yes Equipment/Devices:  Discharge Condition: stable  CODE STATUS: full  Diet recommendation: Heart Healthy  Brief/Interim Summary: HPI was taken from Dr. Para March:  Breanna Lawson is a 77 y.o. female with medical history significant for HTN, CKD 3 A, gout, neuropathy, morbid obesity and frequent falls more recently in the past couple months, who was brought to the ED by EMS following a fall.  Patient uses a walker and wheelchair at baseline initially refused transport, however PT went her home today and noticed O2 to be dropping to the 80s when ambulating.  History is given by the son and daughter-in-law at the bedside who stated that except for falls, patient was at her baseline.  She has had no recent illness.  They did note that she sometimes seems a bit short of breath and sometimes has swelling in her feet but did not otherwise notice anything different.  She has not had any recent illness, no cough, fever or chills, abdominal pain, vomiting or diarrhea.  She denies chest pain.  Her main complaint is pain in her feet from her neuropathy for which she recently saw her neurologist on 6/18 and her Lyrica and nortriptyline were uptitrated per review of the note.  Patient arrived on 2 L O2 ED course and data review: Vitals within normal limits with O2 sat in the mid 80s on 2 L.  ABG showed pH of 7.3 with pCO2 of 76.  Troponin 7 and BNP 23.4.  CBC WNL and procalcitonin less than 0.10.  Creatinine at baseline at 1.45. EKG, Personally viewed and interpreted showed A-fib at 74 CTA chest negative for PE but suggested  pulmonary hypertension and tracheobronchial malacia as follows: IMPRESSION: 1. No pulmonary embolus. 2. Cardiomegaly. Dilated main pulmonary artery suggesting pulmonary arterial hypertension. 3. 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 Mild central bronchial thickening. 4. Nodular hepatic contours suggestive of cirrhosis. Recommend correlation for cirrhosis risk factors.   Patient was treated with IV Lasix, DuoNeb and placed on BiPAP Hospitalist consulted for admission.   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 CTA chest neg for PE. Echo showed EF 60-65%, grade I diastolic dysfunction & mild aortic stenosis. Will need to f/u outpatient w/ cardio & as well as pulmon. Likely needs a sleep study to eval for OSA. Pt and pt's son verbalized their understanding. Weaned off of supplemental oxygen    Pulmonary hypertension: on CT. Continue w/ supportive care. Will need to see pulmon outpatient   Frequent falls: PT/OT recs home health    Neuropathy: continue on amitriptyline, pregabalin    CKDIIIb: Cr is at baseline. Will continue to monitor    Gout: w/o acute flare  Morbid obesity: BMI 50.2. Complicates overall care & prognosis    HTN: continue on home dose of losartan, lasix   Discharge Instructions  Discharge Instructions     Diet - low sodium heart healthy   Complete by: As directed    Discharge instructions   Complete by: As directed    F/u w/ PCP in 1-2 weeks. F/u w/ cardio, either [facility] cardio or [facility]  cardio, in regards to echo results. F/u w/ pulmon, Dr. Belia Heman, in 1-2 weeks for possible eval for OSA   Increase activity slowly   Complete by: As directed       Allergies as of 03/28/2023   No Known Allergies      Medication List     TAKE these medications    allopurinol 100 MG tablet Commonly known as: ZYLOPRIM Take 100 mg by mouth daily.   amLODipine 5 MG tablet Commonly known as: NORVASC Take 5 mg by mouth daily.   furosemide 20 MG tablet Commonly known as: LASIX Take 20 mg by mouth daily as needed for fluid or edema.   losartan 100 MG tablet Commonly known as: COZAAR Take 100 mg by mouth daily.   nortriptyline 50 MG capsule Commonly known as: PAMELOR Take 50 mg by mouth at bedtime.   nortriptyline 10 MG capsule Commonly known as: PAMELOR Take 10 mg by mouth every morning.   pantoprazole 40 MG tablet Commonly known as: PROTONIX Take 40 mg by mouth daily.   pregabalin 200 MG capsule Commonly known as: LYRICA Take 200 mg by mouth 2 (two) times daily.   traMADol 50 MG tablet Commonly known as: ULTRAM Take 50 mg by mouth daily.               Durable Medical Equipment  (From admission, onward)           Start     Ordered   03/28/23 1427  For home use only DME 3 n 1  Once        03/28/23 1426            Follow-up Information     Erin Fulling, MD. Go on 04/15/2023.   Specialties: Pulmonary Disease, Cardiology Why: Likely needs a sleep study to evaluate for OSA.  7/15 @ 4:30 Contact information: [address] [place] [address] [phone]                No Known Allergies  Consultations:    Procedures/Studies: ECHOCARDIOGRAM COMPLETE  Result Date: 03/28/2023    ECHOCARDIOGRAM REPORT   Patient Name:   Breanna Lawson Date of Exam: 03/28/2023 Medical Rec #:  [id number]      Height:       62.0 in Accession #:    [id number]     Weight:       280.4 lb Date of Birth:  1945-12-22     BSA:          2.207 m  Patient Age:    76 years       BP:           122/69 mmHg Patient Gender: F              HR:           79 bpm. Exam Location:  [facility] Procedure: 2D Echo, Color Doppler and Cardiac Doppler Indications:     Syncope  History:         Patient has no prior history of  Echocardiogram examinations.                  Pulmonary HTN; Risk Factors:Hypertension.  Sonographer:     Mikki Harbor Referring Phys:  [id number] Andris Baumann Diagnosing Phys: Lorine Bears MD  Sonographer Comments: Technically difficult study due to poor echo windows and patient is obese. IMPRESSIONS  1. Left ventricular ejection fraction, by estimation, is 60 to 65%. The left ventricle has normal function. The left ventricle has no regional wall motion abnormalities. Left ventricular diastolic parameters are consistent with Grade I diastolic dysfunction (impaired relaxation).  2. Right ventricular systolic function is normal. The right ventricular size is normal. Tricuspid regurgitation signal is inadequate for assessing PA pressure.  3. The mitral valve is normal in structure. No evidence of mitral valve regurgitation. No evidence of mitral stenosis.  4. The aortic valve is normal in structure. Aortic valve regurgitation is not visualized. Mild aortic valve stenosis. Aortic valve mean gradient measures 6.0 mmHg. Aortic valve Vmax measures 1.90 m/s.  5. The inferior vena cava is normal in size with greater than 50% respiratory variability, suggesting right atrial pressure of 3 mmHg. FINDINGS  Left Ventricle: Left ventricular ejection fraction, by estimation, is 60 to 65%. The left ventricle has normal function. The left ventricle has no regional wall motion abnormalities. The left ventricular internal cavity size was normal in size. There is  borderline left ventricular hypertrophy. Left ventricular diastolic parameters are consistent with Grade I diastolic dysfunction (impaired relaxation). Right Ventricle: The right ventricular size is normal. No  increase in right ventricular wall thickness. Right ventricular systolic function is normal. Tricuspid regurgitation signal is inadequate for assessing PA pressure. The tricuspid regurgitant velocity is 1.89 m/s, and with an assumed right atrial pressure of 3 mmHg, the estimated right ventricular systolic pressure is 17.3 mmHg. Left Atrium: Left atrial size was normal in size. Right Atrium: Right atrial size was normal in size. Pericardium: There is no evidence of pericardial effusion. Mitral Valve: The mitral valve is normal in structure. No evidence of mitral valve regurgitation. No evidence of mitral valve stenosis. MV peak gradient, 4.2 mmHg. The mean mitral valve gradient is 2.0 mmHg. Tricuspid Valve: The tricuspid valve is normal in structure. Tricuspid valve regurgitation is not demonstrated. No evidence of tricuspid stenosis. Aortic Valve: The aortic valve is normal in structure. Aortic valve regurgitation is not visualized. Mild aortic stenosis is present. Aortic valve mean gradient measures 6.0 mmHg. Aortic valve peak gradient measures 14.4 mmHg. Aortic valve area, by VTI measures 2.50 cm. Pulmonic Valve: The pulmonic valve was normal in structure. Pulmonic valve regurgitation is not visualized. No evidence of pulmonic stenosis. Aorta: The aortic root is normal in size and structure. Venous: The inferior vena cava is normal in size with greater than 50% respiratory variability, suggesting right atrial pressure of 3 mmHg. IAS/Shunts: No atrial level shunt detected by color flow Doppler.  LEFT VENTRICLE PLAX 2D LVIDd:         4.00 cm   Diastology LVIDs:         2.80 cm   LV e' medial:    10.20 cm/s LV PW:         1.10 cm   LV E/e' medial:  6.9 LV IVS:        1.20 cm   LV e' lateral:   7.62 cm/s LVOT diam:     1.90 cm   LV E/e' lateral: 9.2 LV SV:  77 LV SV Index:   35 LVOT Area:     2.84 cm  RIGHT VENTRICLE RV Basal diam:  3.45 cm RV Mid diam:    2.50 cm LEFT ATRIUM             Index        RIGHT  ATRIUM           Index LA diam:        3.60 cm 1.63 cm/m   RA Area:     19.60 cm LA Vol (A2C):   43.1 ml 19.53 ml/m  RA Volume:   53.30 ml  24.15 ml/m LA Vol (A4C):   37.9 ml 17.17 ml/m LA Biplane Vol: 41.4 ml 18.76 ml/m  AORTIC VALVE                     PULMONIC VALVE AV Area (Vmax):    1.90 cm      PV Vmax:       1.50 m/s AV Area (Vmean):   2.02 cm      PV Peak grad:  9.0 mmHg AV Area (VTI):     2.50 cm AV Vmax:           190.00 cm/s AV Vmean:          110.000 cm/s AV VTI:            0.309 m AV Peak Grad:      14.4 mmHg AV Mean Grad:      6.0 mmHg LVOT Vmax:         127.00 cm/s LVOT Vmean:        78.400 cm/s LVOT VTI:          0.272 m LVOT/AV VTI ratio: 0.88  AORTA Ao Root diam: 3.20 cm MITRAL VALVE                TRICUSPID VALVE MV Area (PHT): 2.97 cm     TR Peak grad:   14.3 mmHg MV Area VTI:   2.04 cm     TR Vmax:        189.00 cm/s MV Peak grad:  4.2 mmHg MV Mean grad:  2.0 mmHg     SHUNTS MV Vmax:       1.03 m/s     Systemic VTI:  0.27 m MV Vmean:      59.8 cm/s    Systemic Diam: 1.90 cm MV Decel Time: 255 msec MV E velocity: 70.20 cm/s MV A velocity: 102.00 cm/s MV E/A ratio:  0.69 Lorine Bears MD Electronically signed by Lorine Bears MD Signature Date/Time: 03/28/2023/1:55:07 PM    Final    CT Angio Chest PE W and/or Wo Contrast  Result Date: 03/27/2023 CLINICAL DATA:  Pulmonary embolism (PE) suspected, high prob Fall at home. EXAM: CT ANGIOGRAPHY CHEST WITH CONTRAST TECHNIQUE: Multidetector CT imaging of the chest was performed using the standard protocol during bolus administration of intravenous contrast. Multiplanar CT image reconstructions and MIPs were obtained to evaluate the vascular anatomy. RADIATION DOSE REDUCTION: This exam was performed according to the departmental dose-optimization program which includes automated exposure control, adjustment of the mA and/or kV according to patient size and/or use of iterative reconstruction technique. CONTRAST:  60mL OMNIPAQUE IOHEXOL 350  MG/ML SOLN COMPARISON:  Radiograph earlier today. FINDINGS: Cardiovascular: There are no filling defects within the pulmonary arteries to suggest pulmonary embolus. Main pulmonary artery is dilated at 3.1 cm. The heart is enlarged. Aortic atherosclerosis. No acute  aortic findings. No pericardial effusion. Mediastinum/Nodes: No adenopathy or mass. Decompressed esophagus. No suspicious thyroid nodule. Lungs/Pleura: Subsegmental atelectasis in the lower lobes. 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 There is mild central bronchial thickening. In no focal airspace disease. No pleural effusion. Upper Abdomen: Enlargement of the liver and spleen. Nodular hepatic contours suggestive of cirrhosis. Cholecystectomy. Musculoskeletal: Remote right rib fractures with incomplete surrounding callus formation. Healed left rib fractures. Diffuse thoracic degenerative change. Scattered calcified Schmorl's nodes. No acute osseous abnormalities are seen. Review of the MIP images confirms the above findings. IMPRESSION: 1. No pulmonary embolus. 2. Cardiomegaly. Dilated main pulmonary artery suggesting pulmonary arterial hypertension. 3. 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 Mild central bronchial thickening. 4. Nodular hepatic contours suggestive of cirrhosis. Recommend correlation for cirrhosis risk factors. Electronically Signed   By: Narda Rutherford M.D.   On: 03/27/2023 03:03   DG Chest 2 View  Result Date: 03/27/2023 CLINICAL DATA:  Status post fall. EXAM: CHEST - 2 VIEW COMPARISON:  September 14, 2011 FINDINGS: There is mild to moderate severity enlargement of the cardiac silhouette. Mild, diffusely increased interstitial lung markings are seen. Mild right infrahilar atelectasis is noted. There is no evidence of focal consolidation, pleural effusion or pneumothorax. Multilevel degenerative changes are seen throughout the thoracic spine. IMPRESSION: Cardiomegaly with mild interstitial edema and  mild right infrahilar atelectasis. Electronically Signed   By: Aram Candela M.D.   On: 03/27/2023 00:41   MM 3D SCREENING MAMMOGRAM BILATERAL BREAST  Result Date: 03/19/2023 CLINICAL DATA:  Screening. EXAM: DIGITAL SCREENING BILATERAL MAMMOGRAM WITH TOMOSYNTHESIS AND CAD TECHNIQUE: Bilateral screening digital craniocaudal and mediolateral oblique mammograms were obtained. Bilateral screening digital breast tomosynthesis was performed. The images were evaluated with computer-aided detection. COMPARISON:  Previous exam(s). ACR Breast Density Category b: There are scattered areas of fibroglandular density. FINDINGS: There are no findings suspicious for malignancy. IMPRESSION: No mammographic evidence of malignancy. A result letter of this screening mammogram will be mailed directly to the patient. RECOMMENDATION: Screening mammogram in one year. (Code:SM-B-01Y) BI-RADS CATEGORY  1: Negative. Electronically Signed   By: Harmon Pier M.D.   On: 03/19/2023 07:20   (Echo, Carotid, EGD, Colonoscopy, ERCP)    Subjective: Pt denies any complaints    Discharge Exam: Vitals:   03/28/23 0505 03/28/23 1230  BP: 122/69 106/71  Pulse: 66 80  Resp: 18 20  Temp: 98.2 F (36.8 C) (!) 97.4 F (36.3 C)  SpO2: 96% 94%   Vitals:   03/27/23 2005 03/28/23 0008 03/28/23 0505 03/28/23 1230  BP: 113/89 137/82 122/69 106/71  Pulse: 72 78 66 80  Resp: 20 18 18 20   Temp: 98 F (36.7 C) 97.8 F (36.6 C) 98.2 F (36.8 C) (!) 97.4 F (36.3 C)  TempSrc:  Oral Oral   SpO2: 93% 96% 96% 94%  Weight:      Height:        General: Pt is alert, awake, not in acute distress. Morbidly obese Cardiovascular: S1/S2 +, no rubs, no gallops Respiratory: decreased breath sounds b/l  Abdominal: Soft, NT, obese, bowel sounds + Extremities:  no cyanosis    The results of significant diagnostics from this hospitalization (including imaging, microbiology, ancillary and laboratory) are listed below for reference.      Microbiology: Recent Results (from the past 240 hour(s))  SARS Coronavirus 2 by RT PCR (hospital order, performed in [facility] hospital lab) *cepheid single result test* Anterior Nasal Swab     Status: None   Collection Time: 03/26/23  11:48 PM   Specimen: Anterior Nasal Swab  Result Value Ref Range Status   SARS Coronavirus 2 by RT PCR NEGATIVE NEGATIVE Final    Comment: (NOTE) SARS-CoV-2 target nucleic acids are NOT DETECTED.  The SARS-CoV-2 RNA is generally detectable in upper and lower respiratory specimens during the acute phase of infection. The lowest concentration of SARS-CoV-2 viral copies this assay can detect is 250 copies / mL. A negative result does not preclude SARS-CoV-2 infection and should not be used as the sole basis for treatment or other patient management decisions.  A negative result may occur with improper specimen collection / handling, submission of specimen other than nasopharyngeal swab, presence of viral mutation(s) within the areas targeted by this assay, and inadequate number of viral copies (<250 copies / mL). A negative result must be combined with clinical observations, patient history, and epidemiological information.  Fact Sheet for Patients:   RoadLapTop.co.za  Fact Sheet for Healthcare Providers: http://kim-miller.com/  This test is not yet approved or  cleared by the [place] FDA and has been authorized for detection and/or diagnosis of SARS-CoV-2 by FDA under an Emergency Use Authorization (EUA).  This EUA will remain in effect (meaning this test can be used) for the duration of the COVID-19 declaration under Section 564(b)(1) of the Act, 21 U.S.C. section 360bbb-3(b)(1), unless the authorization is terminated or revoked sooner.  Performed at [facility], [address] [place], [place] [address]      Labs: BNP (last 3 results) Recent Labs    03/26/23 2345  BNP  23.4   Basic Metabolic Panel: Recent Labs  Lab 03/26/23 1704 03/27/23 0659 03/28/23 0458  NA 134*  --  137  K 4.6  --  4.2  CL 95*  --  95*  CO2 29  --  36*  GLUCOSE 107*  --  99  BUN 25*  --  24*  CREATININE 1.45* 1.43* 1.45*  CALCIUM 9.1  --  9.0   Liver Function Tests: No results for input(s): "AST", "ALT", "ALKPHOS", "BILITOT", "PROT", "ALBUMIN" in the last 168 hours. No results for input(s): "LIPASE", "AMYLASE" in the last 168 hours. No results for input(s): "AMMONIA" in the last 168 hours. CBC: Recent Labs  Lab 03/26/23 1704 03/27/23 0659 03/28/23 0458  WBC 10.1 9.3 7.9  NEUTROABS 8.1*  --   --   HGB 12.0 11.6* 11.6*  HCT 39.9 39.5 39.0  MCV 86.0 87.0 86.7  PLT 267 262 256   Cardiac Enzymes: No results for input(s): "CKTOTAL", "CKMB", "CKMBINDEX", "TROPONINI" in the last 168 hours. BNP: Invalid input(s): "POCBNP" CBG: No results for input(s): "GLUCAP" in the last 168 hours. D-Dimer No results for input(s): "DDIMER" in the last 72 hours. Hgb A1c No results for input(s): "HGBA1C" in the last 72 hours. Lipid Profile No results for input(s): "CHOL", "HDL", "LDLCALC", "TRIG", "CHOLHDL", "LDLDIRECT" in the last 72 hours. Thyroid function studies Recent Labs    03/27/23 0659  TSH 4.563*   Anemia work up No results for input(s): "VITAMINB12", "FOLATE", "FERRITIN", "TIBC", "IRON", "RETICCTPCT" in the last 72 hours. Urinalysis    Component Value Date/Time   COLORURINE YELLOW (A) 03/27/2023 0659   APPEARANCEUR CLEAR (A) 03/27/2023 0659   LABSPEC 1.011 03/27/2023 0659   PHURINE 5.0 03/27/2023 0659   GLUCOSEU NEGATIVE 03/27/2023 0659   HGBUR NEGATIVE 03/27/2023 0659   BILIRUBINUR NEGATIVE 03/27/2023 0659   KETONESUR NEGATIVE 03/27/2023 0659   PROTEINUR NEGATIVE 03/27/2023 0659   NITRITE NEGATIVE 03/27/2023 0272  LEUKOCYTESUR NEGATIVE 03/27/2023 0659   Sepsis Labs Recent Labs  Lab 03/26/23 1704 03/27/23 0659 03/28/23 0458  WBC 10.1 9.3 7.9    Microbiology Recent Results (from the past 240 hour(s))  SARS Coronavirus 2 by RT PCR (hospital order, performed in [facility] hospital lab) *cepheid single result test* Anterior Nasal Swab     Status: None   Collection Time: 03/26/23 11:48 PM   Specimen: Anterior Nasal Swab  Result Value Ref Range Status   SARS Coronavirus 2 by RT PCR NEGATIVE NEGATIVE Final    Comment: (NOTE) SARS-CoV-2 target nucleic acids are NOT DETECTED.  The SARS-CoV-2 RNA is generally detectable in upper and lower respiratory specimens during the acute phase of infection. The lowest concentration of SARS-CoV-2 viral copies this assay can detect is 250 copies / mL. A negative result does not preclude SARS-CoV-2 infection and should not be used as the sole basis for treatment or other patient management decisions.  A negative result may occur with improper specimen collection / handling, submission of specimen other than nasopharyngeal swab, presence of viral mutation(s) within the areas targeted by this assay, and inadequate number of viral copies (<250 copies / mL). A negative result must be combined with clinical observations, patient history, and epidemiological information.  Fact Sheet for Patients:   RoadLapTop.co.za  Fact Sheet for Healthcare Providers: http://kim-miller.com/  This test is not yet approved or  cleared by the [place] FDA and has been authorized for detection and/or diagnosis of SARS-CoV-2 by FDA under an Emergency Use Authorization (EUA).  This EUA will remain in effect (meaning this test can be used) for the duration of the COVID-19 declaration under Section 564(b)(1) of the Act, 21 U.S.C. section 360bbb-3(b)(1), unless the authorization is terminated or revoked sooner.  Performed at [facility], [address], [place], [place] [address]      Time coordinating discharge: Over 30 minutes  SIGNED:   Charise Killian, MD  [facility] 03/28/2023, 2:42 PM Pager   If 7PM-7AM, please contact night-coverage [email or web address]

## 2023-03-28 NOTE — Evaluation (Addendum)
Physical Therapy Evaluation Patient Details Name: Breanna Lawson MRN: [id number] DOB: 11-Jan-1946 Today's Date: 03/28/2023  History of Present Illness  Pt is a 77 y.o. female with PMH consisting ofHTN, CKD 3 A, gout, neuropathy, morbid obesity and frequent falls. Pt admitted to ED with generalized weakness and SOB (6/25) after falling at home. MD assessment includes: acute on chronic respiratory failure with hypoxia and hypercapnia, pulmonary HTN.  Clinical Impression  Pt pleasant and motivated for therapy, on 2 L O2 upon entering room. [place] taken off at start with SpO2 in high 90's, but dropped initially with pt conversing in supine to 88%. [place] placed back on while performing mobility tasks at start, but taken back off upon standing to determine O2 needs at home. Pt needed min A for supine-sit for trunk control. Pt was able to perform STS from elevated bed height, BSC and recliner with only min guard for safety. Pt able to perform 3 bouts of ambulation, 1 x 10 feet to bathroom, 1 x 50 feet, 1 x 120 feet, all on room air with short seated rest breaks in between each bout. HR and SpO2 checked frequently throughout ambulation, O2 never dropped below 88% and HR remained mid to high 90's. Pt demonstrated a slow but consistent cadence with trunk flexed posture throughout; pt tolerated initial bouts of ambulation well but needed to sit rather abruptly once fatigue set in. Pt appears to be at or near baseline physical functioning. Pt will benefit from continued PT services upon discharge to safely address deficits listed in patient problem list for decreased caregiver assistance and eventual return to PLOF.      Recommendations for follow up therapy are one component of a multi-disciplinary discharge planning process, led by the attending physician.  Recommendations may be updated based on patient status, additional functional criteria and insurance authorization.  Follow Up Recommendations       Assistance  Recommended at Discharge Intermittent Supervision/Assistance  Patient can return home with the following  A little help with walking and/or transfers;A little help with bathing/dressing/bathroom;Assist for transportation;Assistance with cooking/housework    Equipment Recommendations Rolling walker (2 wheels);BSC/3in1  Recommendations for Other Services       Functional Status Assessment Patient has not had a recent decline in their functional status     Precautions / Restrictions Precautions Precautions: Fall Restrictions Weight Bearing Restrictions: No      Mobility  Bed Mobility Overal bed mobility: Needs Assistance Bed Mobility: Supine to Sit     Supine to sit: Min assist     General bed mobility comments: Min A with HHA for sup-sit for trunk control    Transfers Overall transfer level: Needs assistance Equipment used: Rolling walker (2 wheels) Transfers: Sit to/from Stand Sit to Stand: Min guard           General transfer comment: Failed first two attempts but able to rise with min guard from elevated bed surface. Heavy use of UE's on RW.    Ambulation/Gait Ambulation/Gait assistance: Min guard Gait Distance (Feet): 1 x 10 feet, 1 x 50 feet, 1 x 120 feet Assistive device: Rolling walker (2 wheels) Gait Pattern/deviations: Decreased step length - left, Decreased step length - right, Trunk flexed Gait velocity: decreased     General Gait Details: Pt able to perform 3 bouts of ambulation with only min guard and RW for safety. Slow but consistent cadence, no instances of LOB or staggering.  Stairs  Wheelchair Mobility    Modified Rankin (Stroke Patients Only)       Balance Overall balance assessment: Needs assistance Sitting-balance support: Feet supported, No upper extremity supported Sitting balance-Leahy Scale: Good Sitting balance - Comments: Pt able to sit EOB with no UE support and no LOB   Standing balance support: Bilateral  upper extremity supported, During functional activity, Reliant on assistive device for balance Standing balance-Leahy Scale: Fair Standing balance comment: Pt able to stand EOB for urination and cleanup x3-4 minutes with no LOB, alternating unilateral and bilateral UE's on RW                             Pertinent Vitals/Pain Pain Assessment Pain Assessment: Faces Faces Pain Scale: Hurts a little bit Pain Location: Bilateral knees Pain Intervention(s): Monitored during session, Repositioned, Limited activity within patient's tolerance    Home Living Family/patient expects to be discharged to:: Private residence Living Arrangements: Alone Available Help at Discharge: Friend(s);Neighbor;Available PRN/intermittently Type of Home: Apartment Home Access: Level entry       Home Layout: One level Home Equipment: Rollator (4 wheels);Wheelchair - manual;Grab bars - tub/shower;Tub bench      Prior Function Prior Level of Function : Independent/Modified Independent             Mobility Comments: Pt reported mod I with mobility at home, uses rollator 24/7, mentioned using w/c sometimes at night to help her get to bathroom. Per pt, moderate falls history (unable to give number) secondary to legs getting weak. Described household ambulator. ADLs Comments: Independent with all ADL's. Gets assist for getting to doctor's office, getting groceries.     Hand Dominance        Extremity/Trunk Assessment   Upper Extremity Assessment Upper Extremity Assessment: Overall WFL for tasks assessed    Lower Extremity Assessment Lower Extremity Assessment: Generalized weakness       Communication   Communication: No difficulties  Cognition Arousal/Alertness: Awake/alert Behavior During Therapy: WFL for tasks assessed/performed Overall Cognitive Status: Within Functional Limits for tasks assessed                                          General Comments General  comments (skin integrity, edema, etc.): Pt had to urinate frequently at start of session, mostly unable to tell or control bladder when performing supine-sit/STS.    Exercises     Assessment/Plan    PT Assessment Patient needs continued PT services  PT Problem List Decreased strength;Decreased activity tolerance;Decreased balance       PT Treatment Interventions DME instruction;Therapeutic exercise;Gait training;Therapeutic activities;Patient/family education    PT Goals (Current goals can be found in the Care Plan section)  Acute Rehab PT Goals Patient Stated Goal: Walk better PT Goal Formulation: With patient Time For Goal Achievement: 04/10/23 Potential to Achieve Goals: Good    Frequency Min 2X/week     Co-evaluation               AM-PAC PT "6 Clicks" Mobility  Outcome Measure Help needed turning from your back to your side while in a flat bed without using bedrails?: A Little Help needed moving from lying on your back to sitting on the side of a flat bed without using bedrails?: A Little Help needed moving to and from a bed to a chair (including a wheelchair)?: A  Little Help needed standing up from a chair using your arms (e.g., wheelchair or bedside chair)?: A Little Help needed to walk in hospital room?: A Little Help needed climbing 3-5 steps with a railing? : A Lot 6 Click Score: 17    End of Session Equipment Utilized During Treatment: Gait belt;Oxygen Activity Tolerance: Patient tolerated treatment well Patient left: in chair;with family/visitor present;with call bell/phone within reach;with chair alarm set Nurse Communication: Mobility status;Other (comment) (SpO2 response to activity) PT Visit Diagnosis: Unsteadiness on feet (R26.81);History of falling (Z91.81);Repeated falls (R29.6);Muscle weakness (generalized) (M62.81);Difficulty in walking, not elsewhere classified (R26.2)    Time: 1610-9604 PT Time Calculation (min) (ACUTE ONLY): 70  min   Charges:              Cena Benton, SPT 03/28/23, 1:45 PM This entire session was performed under direct supervision and direction of a licensed therapist/therapist assistant. I have personally read, edited and approve of the note as written.  Loran Senters, DPT

## 2023-03-28 NOTE — TOC CM/SW Note (Signed)
Patient is not able to walk the distance required to go the bathroom, or he/she is unable to safely negotiate stairs required to access the bathroom.  A 3in1 BSC will alleviate this problem  

## 2023-03-29 DIAGNOSIS — J9622 Acute and chronic respiratory failure with hypercapnia: Secondary | ICD-10-CM | POA: Diagnosis not present

## 2023-03-29 DIAGNOSIS — N1832 Chronic kidney disease, stage 3b: Secondary | ICD-10-CM | POA: Diagnosis not present

## 2023-03-29 DIAGNOSIS — N179 Acute kidney failure, unspecified: Secondary | ICD-10-CM | POA: Diagnosis not present

## 2023-03-29 DIAGNOSIS — I130 Hypertensive heart and chronic kidney disease with heart failure and stage 1 through stage 4 chronic kidney disease, or unspecified chronic kidney disease: Secondary | ICD-10-CM | POA: Diagnosis not present

## 2023-03-29 DIAGNOSIS — J9621 Acute and chronic respiratory failure with hypoxia: Secondary | ICD-10-CM | POA: Diagnosis not present

## 2023-03-29 DIAGNOSIS — I5032 Chronic diastolic (congestive) heart failure: Secondary | ICD-10-CM | POA: Diagnosis not present

## 2023-03-29 DIAGNOSIS — G629 Polyneuropathy, unspecified: Secondary | ICD-10-CM | POA: Diagnosis not present

## 2023-03-29 DIAGNOSIS — Z6841 Body Mass Index (BMI) 40.0 and over, adult: Secondary | ICD-10-CM | POA: Diagnosis not present

## 2023-03-30 ENCOUNTER — Other Ambulatory Visit: Payer: Self-pay

## 2023-03-30 ENCOUNTER — Emergency Department: Payer: Medicare HMO

## 2023-03-30 ENCOUNTER — Observation Stay
Admission: EM | Admit: 2023-03-30 | Discharge: 2023-03-31 | Disposition: A | Discharge status: Home Health | Payer: Medicare HMO | Attending: Internal Medicine | Admitting: Internal Medicine

## 2023-03-30 DIAGNOSIS — I959 Hypotension, unspecified: Secondary | ICD-10-CM | POA: Diagnosis not present

## 2023-03-30 DIAGNOSIS — R42 Dizziness and giddiness: Secondary | ICD-10-CM | POA: Diagnosis not present

## 2023-03-30 DIAGNOSIS — N179 Acute kidney failure, unspecified: Secondary | ICD-10-CM

## 2023-03-30 DIAGNOSIS — I4430 Unspecified atrioventricular block: Secondary | ICD-10-CM | POA: Diagnosis not present

## 2023-03-30 DIAGNOSIS — J398 Other specified diseases of upper respiratory tract: Secondary | ICD-10-CM | POA: Diagnosis present

## 2023-03-30 DIAGNOSIS — Z6841 Body Mass Index (BMI) 40.0 and over, adult: Secondary | ICD-10-CM | POA: Insufficient documentation

## 2023-03-30 DIAGNOSIS — N1832 Chronic kidney disease, stage 3b: Secondary | ICD-10-CM | POA: Diagnosis not present

## 2023-03-30 DIAGNOSIS — I5032 Chronic diastolic (congestive) heart failure: Secondary | ICD-10-CM | POA: Diagnosis not present

## 2023-03-30 DIAGNOSIS — R0902 Hypoxemia: Secondary | ICD-10-CM | POA: Diagnosis not present

## 2023-03-30 DIAGNOSIS — Z79899 Other long term (current) drug therapy: Secondary | ICD-10-CM | POA: Diagnosis not present

## 2023-03-30 DIAGNOSIS — I130 Hypertensive heart and chronic kidney disease with heart failure and stage 1 through stage 4 chronic kidney disease, or unspecified chronic kidney disease: Secondary | ICD-10-CM | POA: Diagnosis not present

## 2023-03-30 DIAGNOSIS — R55 Syncope and collapse: Secondary | ICD-10-CM | POA: Diagnosis not present

## 2023-03-30 DIAGNOSIS — J9622 Acute and chronic respiratory failure with hypercapnia: Secondary | ICD-10-CM | POA: Insufficient documentation

## 2023-03-30 DIAGNOSIS — R531 Weakness: Secondary | ICD-10-CM | POA: Diagnosis not present

## 2023-03-30 DIAGNOSIS — R0602 Shortness of breath: Secondary | ICD-10-CM | POA: Diagnosis not present

## 2023-03-30 DIAGNOSIS — G629 Polyneuropathy, unspecified: Secondary | ICD-10-CM

## 2023-03-30 DIAGNOSIS — J9601 Acute respiratory failure with hypoxia: Secondary | ICD-10-CM | POA: Diagnosis not present

## 2023-03-30 DIAGNOSIS — G4734 Idiopathic sleep related nonobstructive alveolar hypoventilation: Secondary | ICD-10-CM | POA: Insufficient documentation

## 2023-03-30 DIAGNOSIS — R0989 Other specified symptoms and signs involving the circulatory and respiratory systems: Secondary | ICD-10-CM | POA: Diagnosis not present

## 2023-03-30 DIAGNOSIS — J9621 Acute and chronic respiratory failure with hypoxia: Secondary | ICD-10-CM | POA: Diagnosis not present

## 2023-03-30 DIAGNOSIS — I10 Essential (primary) hypertension: Secondary | ICD-10-CM | POA: Diagnosis present

## 2023-03-30 DIAGNOSIS — R001 Bradycardia, unspecified: Secondary | ICD-10-CM | POA: Diagnosis not present

## 2023-03-30 LAB — COMPREHENSIVE METABOLIC PANEL
ALT: 14 U/L (ref 0–44)
AST: 23 U/L (ref 15–41)
Albumin: 3.3 g/dL — ABNORMAL LOW (ref 3.5–5.0)
Alkaline Phosphatase: 90 U/L (ref 38–126)
Anion gap: 9 (ref 5–15)
BUN: 31 mg/dL — ABNORMAL HIGH (ref 8–23)
CO2: 32 mmol/L (ref 22–32)
Calcium: 9.3 mg/dL (ref 8.9–10.3)
Chloride: 97 mmol/L — ABNORMAL LOW (ref 98–111)
Creatinine, Ser: 1.83 mg/dL — ABNORMAL HIGH (ref 0.44–1.00)
GFR, Estimated: 29 mL/min — ABNORMAL LOW (ref >60)
Glucose, Bld: 91 mg/dL (ref 70–99)
Potassium: 3.8 mmol/L (ref 3.5–5.1)
Sodium: 138 mmol/L (ref 135–145)
Total Bilirubin: 0.2 mg/dL — ABNORMAL LOW (ref 0.3–1.2)
Total Protein: 6.6 g/dL (ref 6.5–8.1)

## 2023-03-30 LAB — CBC WITH DIFFERENTIAL/PLATELET
Abs Immature Granulocytes: 0.04 10*3/uL (ref 0.00–0.07)
Basophils Absolute: 0 10*3/uL (ref 0.0–0.1)
Basophils Relative: 0 %
Eosinophils Absolute: 0.2 10*3/uL (ref 0.0–0.5)
Eosinophils Relative: 2 %
HCT: 39.2 % (ref 36.0–46.0)
Hemoglobin: 11.8 g/dL — ABNORMAL LOW (ref 12.0–15.0)
Immature Granulocytes: 1 %
Lymphocytes Relative: 19 %
Lymphs Abs: 1.7 10*3/uL (ref 0.7–4.0)
MCH: 25.9 pg — ABNORMAL LOW (ref 26.0–34.0)
MCHC: 30.1 g/dL (ref 30.0–36.0)
MCV: 86.2 fL (ref 80.0–100.0)
Monocytes Absolute: 0.5 10*3/uL (ref 0.1–1.0)
Monocytes Relative: 6 %
Neutro Abs: 6.4 10*3/uL (ref 1.7–7.7)
Neutrophils Relative %: 72 %
Platelets: 245 10*3/uL (ref 150–400)
RBC: 4.55 MIL/uL (ref 3.87–5.11)
RDW: 16.6 % — ABNORMAL HIGH (ref 11.5–15.5)
WBC: 8.8 10*3/uL (ref 4.0–10.5)
nRBC: 0 % (ref 0.0–0.2)

## 2023-03-30 LAB — TROPONIN I (HIGH SENSITIVITY): Troponin I (High Sensitivity): 8 ng/L (ref <18)

## 2023-03-30 LAB — BRAIN NATRIURETIC PEPTIDE: B Natriuretic Peptide: 34.4 pg/mL (ref 0.0–100.0)

## 2023-03-30 MED ORDER — ENOXAPARIN SODIUM 40 MG/0.4ML IJ SOSY: 40.0000 mg | PREFILLED_SYRINGE | INTRAMUSCULAR | Status: DC

## 2023-03-30 MED ORDER — ACETAMINOPHEN 325 MG PO TABS: 650.0000 mg | ORAL_TABLET | Freq: Four times a day (QID) | ORAL | Status: DC | PRN

## 2023-03-30 MED ORDER — PREGABALIN 50 MG PO CAPS
200.0000 mg | ORAL_CAPSULE | Freq: Two times a day (BID) | ORAL | Status: DC
  Administered 2023-03-30: 200 mg via ORAL
  Filled 2023-03-30 (×2): qty 4

## 2023-03-30 MED ORDER — ENOXAPARIN SODIUM 80 MG/0.8ML IJ SOSY
0.5000 mg/kg | PREFILLED_SYRINGE | INTRAMUSCULAR | Status: DC
  Administered 2023-03-30: 62.5 mg via SUBCUTANEOUS
  Filled 2023-03-30: qty 0.63
  Filled 2023-03-30: qty 0.8

## 2023-03-30 MED ORDER — SODIUM CHLORIDE 0.9 % IV BOLUS
500.0000 mL | Freq: Once | INTRAVENOUS | Status: AC
  Administered 2023-03-30: 500 mL via INTRAVENOUS

## 2023-03-30 MED ORDER — NORTRIPTYLINE HCL 25 MG PO CAPS
50.0000 mg | ORAL_CAPSULE | Freq: Every day | ORAL | Status: DC
  Administered 2023-03-30: 50 mg via ORAL
  Filled 2023-03-30: qty 2

## 2023-03-30 MED ORDER — ACETAMINOPHEN 650 MG RE SUPP: 650.0000 mg | Freq: Four times a day (QID) | RECTAL | Status: DC | PRN

## 2023-03-30 MED ORDER — SODIUM CHLORIDE 0.9% FLUSH
3.0000 mL | Freq: Two times a day (BID) | INTRAVENOUS | Status: DC
  Administered 2023-03-30: 3 mL via INTRAVENOUS

## 2023-03-30 MED ORDER — AMLODIPINE BESYLATE 5 MG PO TABS: 5.0000 mg | ORAL_TABLET | Freq: Every day | ORAL | Status: DC

## 2023-03-30 MED ORDER — SODIUM CHLORIDE 0.9 % IV SOLN: INTRAVENOUS | Status: DC

## 2023-03-30 MED ORDER — ONDANSETRON HCL 4 MG PO TABS: 4.0000 mg | ORAL_TABLET | Freq: Four times a day (QID) | ORAL | Status: DC | PRN

## 2023-03-30 MED ORDER — ONDANSETRON HCL 4 MG/2ML IJ SOLN: 4.0000 mg | Freq: Four times a day (QID) | INTRAMUSCULAR | Status: DC | PRN

## 2023-03-30 MED ORDER — ORAL CARE MOUTH RINSE: 15.0000 mL | OROMUCOSAL | Status: DC | PRN

## 2023-03-30 NOTE — ED Triage Notes (Signed)
Per EMS, pt called for BP check due to dizziness when standing. BP 80s/40s on scene.; O2 90% on RA. Pt has no complaints of SOB. Upon arrival to ED, BP is 113/55; O2 93% on 2L [place].

## 2023-03-30 NOTE — Assessment & Plan Note (Signed)
Possibly related to diuretics and recently treated with diuretics for suspicion of CHF Received a 500 mL bolus with EMS and another 500 in the ED Avoid nephrotoxins

## 2023-03-30 NOTE — Progress Notes (Signed)
Overnight oximetry initiated on 2L Current O2 sat 97%

## 2023-03-30 NOTE — Assessment & Plan Note (Addendum)
History of falls Secondary to AKI related to diuretics, home antihypertensives, hypercapnia/respiratory failure, multiple pain meds for neuropathy Overnight oximetry ordered Cardiac monitoring Fall precautions Orthostatic vital signs

## 2023-03-30 NOTE — Progress Notes (Signed)
PHARMACIST - PHYSICIAN COMMUNICATION  CONCERNING:  Enoxaparin (Lovenox) for DVT Prophylaxis    RECOMMENDATION: Patient was prescribed enoxaprin 40mg  q24 hours for VTE prophylaxis.   Filed Weights   03/30/23 1553  Weight: 127.2 kg (280 lb 6.8 oz)    Body mass index is 51.29 kg/m.  Estimated Creatinine Clearance: 34.4 mL/min (A) (by C-G formula based on SCr of 1.83 mg/dL (H)).   Based on [facility] policy patient is candidate for enoxaparin 0.5mg /kg TBW SQ every 24 hours based on BMI being >30.  DESCRIPTION: Pharmacy has adjusted enoxaparin dose per [facility] policy.  Patient is now receiving enoxaparin 62.5 mg every 24 hours    Foye Deer, PharmD Clinical Pharmacist  03/30/2023 8:12 PM

## 2023-03-30 NOTE — Assessment & Plan Note (Signed)
Patient is currently on Lyrica 200 mg twice a day, Nortriptyline 50 mg at night, Tramadol 50 mg daily to twice daily

## 2023-03-30 NOTE — H&P (Signed)
History and Physical    Patient: Breanna Lawson WUJ:[id number] DOB: June 11, 1946 DOA: 03/30/2023 DOS: the patient was seen and examined on 03/30/2023 PCP: Breanna Paradise, MD  Patient coming from: Home  Chief Complaint:  Chief Complaint  Patient presents with  . Hypotension    HPI: Breanna Lawson is a 77 y.o. female with medical history significant for HTN, CKD A, neuropathy on multiple related meds, morbid obesity, BMI over 50, frequent falls, diastolic CHF (EF 60 to 65% with G1 DD 03/28/2023), tracheobronchial malacia and pulmonary hypertension (on CTA chest 03/26/2026), recently hospitalized from 6/27 to 6/28 with respiratory failure of multifactorial etiology, discharged on room air with pulmonology follow-up,Who presents to the ED with dizziness on standing.  BP on the scene was 80s over 40s with O2 sat 90% on room air.  She was bolused by EMS with improvement in BP to 113/55 on arrival.  She was placed on O2 at 2 L by EMS. ED course and data review: BP initially 113/55, dropping to 99/73, but again fluid responsive to 112/72 with otherwise normal vitals.  Troponin 8 and BNP 34.4.  Creatinine 1.83 above baseline of 1.2 in January 2024.  Serum bicarb 32.  Other labs at baseline. EKG, personally viewed and interpreted showing sinus rhythm at 71 with no acute ST-T wave changes. Chest x-ray nonacute Patient treated with a 500 mL bolus in the ED. Patient was ambulated in the ED with a drop in oxygen saturation to 81% on room air, improving to 92 to 95% with rest. Hospitalist consulted for admission.   Review of Systems: As mentioned in the history of present illness. All other systems reviewed and are negative.  Past Medical History:  Diagnosis Date  . Arthritis   . Chronic kidney disease   . GERD (gastroesophageal reflux disease)   . Hypertension    Past Surgical History:  Procedure Laterality Date  . ANKLE SURGERY Left   . CHOLECYSTECTOMY    . COLONOSCOPY     more than 10 years  ago.   [facility] DILATION AND CURETTAGE OF UTERUS    . EYE SURGERY    . INSERTION OF MESH N/A 03/25/2015   Procedure: INSERTION OF MESH;  Surgeon: Kieth Brightly, MD;  Location: [facility] ORS;  Service: General;  Laterality: N/A;  . VENTRAL HERNIA REPAIR N/A 03/25/2015   Procedure: HERNIA REPAIR VENTRAL ADULT;  Surgeon: Kieth Brightly, MD;  Location: [facility] ORS;  Service: General;  Laterality: N/A;   Social History:  reports that she has never smoked. She has never used smokeless tobacco. She reports that she does not drink alcohol and does not use drugs.  No Known Allergies  Family History  Problem Relation Age of Onset  . Heart attack Mother   . Heart failure Father   . Breast cancer Sister     Prior to Admission medications   Medication Sig Start Date End Date Taking? Authorizing Provider  allopurinol (ZYLOPRIM) 100 MG tablet Take 100 mg by mouth daily. 07/17/17   [provider]  amLODipine (NORVASC) 5 MG tablet Take 5 mg by mouth daily. 02/02/19   [provider]  furosemide (LASIX) 20 MG tablet Take 20 mg by mouth daily as needed for fluid or edema. 11/26/18   [provider]  losartan (COZAAR) 100 MG tablet Take 100 mg by mouth daily. 10/09/17   [provider]  nortriptyline (PAMELOR) 10 MG capsule Take 10 mg by mouth every morning. 03/20/23   [provider]  nortriptyline (PAMELOR) 50 MG capsule Take 50 mg by mouth at bedtime. 01/23/22   [provider]  pantoprazole (PROTONIX) 40 MG tablet Take 40 mg by mouth daily. 09/02/19   [provider]  pregabalin (LYRICA) 200 MG capsule Take 200 mg by mouth 2 (two) times daily. 12/20/22   [provider]  traMADol (ULTRAM) 50 MG tablet Take 50 mg by mouth daily. 09/20/17   [provider]    Physical Exam: Vitals:   03/30/23 1553 03/30/23 1600 03/30/23 1737 03/30/23 1815  BP:  99/73 123/61 112/72  Pulse:  68 67 80  Resp:  12 15 16   Temp:   98 F (36.7 C)    TempSrc:   Oral   SpO2:  97% 96% 95%  Weight: 127.2 kg     Height: 5\' 2"  (1.575 m)      Physical Exam  Labs on Admission: I have personally reviewed following labs and imaging studies  CBC: Recent Labs  Lab 03/26/23 1704 03/27/23 0659 03/28/23 0458 03/30/23 1608  WBC 10.1 9.3 7.9 8.8  NEUTROABS 8.1*  --   --  6.4  HGB 12.0 11.6* 11.6* 11.8*  HCT 39.9 39.5 39.0 39.2  MCV 86.0 87.0 86.7 86.2  PLT 267 262 256 245   Basic Metabolic Panel: Recent Labs  Lab 03/26/23 1704 03/27/23 0659 03/28/23 0458 03/30/23 1608  NA 134*  --  137 138  K 4.6  --  4.2 3.8  CL 95*  --  95* 97*  CO2 29  --  36* 32  GLUCOSE 107*  --  99 91  BUN 25*  --  24* 31*  CREATININE 1.45* 1.43* 1.45* 1.83*  CALCIUM 9.1  --  9.0 9.3   GFR: Estimated Creatinine Clearance: 34.4 mL/min (A) (by C-G formula based on SCr of 1.83 mg/dL (H)). Liver Function Tests: Recent Labs  Lab 03/30/23 1608  AST 23  ALT 14  ALKPHOS 90  BILITOT 0.2*  PROT 6.6  ALBUMIN 3.3*   No results for input(s): "LIPASE", "AMYLASE" in the last 168 hours. No results for input(s): "AMMONIA" in the last 168 hours. Coagulation Profile: No results for input(s): "INR", "PROTIME" in the last 168 hours. Cardiac Enzymes: No results for input(s): "CKTOTAL", "CKMB", "CKMBINDEX", "TROPONINI" in the last 168 hours. BNP (last 3 results) No results for input(s): "PROBNP" in the last 8760 hours. HbA1C: No results for input(s): "HGBA1C" in the last 72 hours. CBG: No results for input(s): "GLUCAP" in the last 168 hours. Lipid Profile: No results for input(s): "CHOL", "HDL", "LDLCALC", "TRIG", "CHOLHDL", "LDLDIRECT" in the last 72 hours. Thyroid Function Tests: No results for input(s): "TSH", "T4TOTAL", "FREET4", "T3FREE", "THYROIDAB" in the last 72 hours. Anemia Panel: No results for input(s): "VITAMINB12", "FOLATE", "FERRITIN", "TIBC", "IRON", "RETICCTPCT" in the last 72 hours. Urine analysis:    Component Value Date/Time    COLORURINE YELLOW (A) 03/27/2023 0659   APPEARANCEUR CLEAR (A) 03/27/2023 0659   LABSPEC 1.011 03/27/2023 0659   PHURINE 5.0 03/27/2023 0659   GLUCOSEU NEGATIVE 03/27/2023 0659   HGBUR NEGATIVE 03/27/2023 0659   BILIRUBINUR NEGATIVE 03/27/2023 0659   KETONESUR NEGATIVE 03/27/2023 0659   PROTEINUR NEGATIVE 03/27/2023 0659   NITRITE NEGATIVE 03/27/2023 0659   LEUKOCYTESUR NEGATIVE 03/27/2023 0659    Radiological Exams on Admission: DG Chest 2 View  Result Date: 03/30/2023 CLINICAL DATA:  Weakness and shortness of breath EXAM: CHEST - 2 VIEW COMPARISON:  Chest x-ray March 27, 2023, chest CT angiogram March 27, 2023 FINDINGS: The cardiomediastinal silhouette is unchanged in contour. Low lung volumes with bronchovascular crowding. No focal pulmonary opacity. No pleural effusion or pneumothorax. The visualized upper abdomen is unremarkable. No acute osseous abnormality. IMPRESSION: No acute cardiopulmonary abnormality. Electronically Signed   By: Jacob Moores M.D.   On: 03/30/2023 17:31     Data Reviewed: Relevant notes from primary care and specialist visits, past discharge summaries as available in EHR, including Care Everywhere. 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  Second admission in a few days with similar finding Supplemental oxygen Overnight oximetry to see if patient qualifies for continuous home oxygen   Postural dizziness with presyncope History of  falls Secondary to AKI related to diuretics, home antihypertensives, hypercapnia/respiratory failure, multiple pain meds for neuropathy Overnight oximetry ordered Cardiac monitoring Fall precautions Orthostatic vital signs  Acute renal failure superimposed on stage 3a chronic kidney disease (HCC) Possibly related to diuretics and recently treated with diuretics for suspicion of CHF Received a 500 mL bolus with EMS and another 500 in the ED Avoid nephrotoxins  Chronic diastolic CHF (congestive heart failure) (HCC) Clinically euvolemic, chest x-ray clear and BNP in the 30s Echo from 6/27 with EF 60 to 65% and G1 DD Hold diuretics  Neuropathy Patient is currently on Lyrica 200 mg twice a day, Nortriptyline 50 mg at night, Tramadol 50 mg daily to twice daily  Hypertension BP controlled Hold furosemide Can continue amlodipine    DVT prophylaxis: Lovenox  Consults: none  Advance Care Planning:   Code Status: Prior   Family Communication: none***  Disposition Plan: Back to previous home environment  Severity of Illness: The appropriate patient status for this patient is OBSERVATION. Observation status is judged to be reasonable and necessary in order to provide the required intensity of service to ensure the patient's safety. The patient's presenting symptoms, physical exam findings, and initial radiographic and laboratory data in the context of their medical condition is felt to place them at decreased risk for further clinical deterioration. Furthermore, it is anticipated that the patient will be medically stable for discharge from the hospital within 2 midnights of admission.   Author: Andris Baumann, MD 03/30/2023 7:50 PM  For on call review [email or web address].

## 2023-03-30 NOTE — Assessment & Plan Note (Signed)
BP controlled Hold furosemide Can continue amlodipine

## 2023-03-30 NOTE — Assessment & Plan Note (Addendum)
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  Second admission in a few days with similar finding Supplemental oxygen Overnight oximetry to see if patient qualifies for continuous home oxygen

## 2023-03-30 NOTE — Assessment & Plan Note (Signed)
Clinically euvolemic, chest x-ray clear and BNP in the 30s Echo from 6/27 with EF 60 to 65% and G1 DD Hold diuretics

## 2023-03-30 NOTE — ED Notes (Signed)
Pt ambulated around room; O2 dropped to 81% on RA. No complaints of dizziness. Pt denies feeling short of breath. O2 increases to 92-95% quickly when pt is resting.

## 2023-03-30 NOTE — ED Provider Notes (Signed)
[facility] Provider Note    Event Date/Time   First MD Initiated Contact with Patient 03/30/23 1548     (approximate)   History   Chief Complaint Hypotension   HPI  Breanna Lawson is a 77 y.o. female with past medical history of hypertension, CKD, GERD, pulmonary hypertension, and tracheobronchomalacia who presents to the ED complaining of hypotension.  Patient reports that she has been feeling dizzy and lightheaded like she might pass out when standing and walking over the past couple of days.  She states that her neighbor came over to check on her today, found her blood pressure to be low at that time.  EMS was called and reports initial BP was 80s over 40s, subsequently improved with about 400 cc of IV fluids.  Patient currently states she feels well, denies any fevers, cough, chest pain, shortness of breath, nausea, vomiting, diarrhea, or dysuria.     Physical Exam   Triage Vital Signs: ED Triage Vitals  Enc Vitals Group     BP      Pulse      Resp      Temp      Temp src      SpO2      Weight      Height      Head Circumference      Peak Flow      Pain Score      Pain Loc      Pain Edu?      Excl. in GC?     Most recent vital signs: Vitals:   03/30/23 1737 03/30/23 1815  BP: 123/61 112/72  Pulse: 67 80  Resp: 15 16  Temp: 98 F (36.7 C)   SpO2: 96% 95%    Constitutional: Alert and oriented. Eyes: Conjunctivae are normal. Head: Atraumatic. Nose: No congestion/rhinnorhea. Mouth/Throat: Mucous membranes are moist.  Cardiovascular: Normal rate, regular rhythm. Grossly normal heart sounds.  2+ radial pulses bilaterally. Respiratory: Normal respiratory effort.  No retractions. Lungs CTAB. Gastrointestinal: Soft and nontender. No distention. Musculoskeletal: No lower extremity tenderness, 1+ pitting edema to knees bilaterally. Neurologic:  Normal speech and language. No gross focal neurologic deficits are appreciated.    ED  Results / Procedures / Treatments   Labs (all labs ordered are listed, but only abnormal results are displayed) Labs Reviewed  CBC WITH DIFFERENTIAL/PLATELET - Abnormal; Notable for the following components:      Result Value   Hemoglobin 11.8 (*)    MCH 25.9 (*)    RDW 16.6 (*)    All other components within normal limits  COMPREHENSIVE METABOLIC PANEL - Abnormal; Notable for the following components:   Chloride 97 (*)    BUN 31 (*)    Creatinine, Ser 1.83 (*)    Albumin 3.3 (*)    Total Bilirubin 0.2 (*)    GFR, Estimated 29 (*)    All other components within normal limits  BRAIN NATRIURETIC PEPTIDE  URINALYSIS, ROUTINE W REFLEX MICROSCOPIC  TROPONIN I (HIGH SENSITIVITY)     EKG  ED ECG REPORT I, Chesley Noon, the attending physician, personally viewed and interpreted this ECG.   Date: 03/30/2023  EKG Time: 15:47  Rate: 71  Rhythm: normal sinus rhythm  Axis: Normal  Intervals:first-degree A-V block   ST&T Change: None  RADIOLOGY Chest x-ray reviewed and interpreted by me with no infiltrate, edema, or effusion.  PROCEDURES:  Critical Care performed: Yes, see critical care procedure note(s)  .  Critical Care  Performed by: Chesley Noon, MD Authorized by: Chesley Noon, MD   Critical care provider statement:    Critical care time (minutes):  30   Critical care time was exclusive of:  Separately billable procedures and treating other patients and teaching time   Critical care was necessary to treat or prevent imminent or life-threatening deterioration of the following conditions:  Respiratory failure   Critical care was time spent personally by me on the following activities:  Development of treatment plan with patient or surrogate, discussions with consultants, evaluation of patient's response to treatment, examination of patient, ordering and review of laboratory studies, ordering and review of radiographic studies, ordering and performing treatments and  interventions, pulse oximetry, re-evaluation of patient's condition and review of old charts   I assumed direction of critical care for this patient from another provider in my specialty: no     Care discussed with: admitting provider      MEDICATIONS ORDERED IN ED: Medications  sodium chloride 0.9 % bolus 500 mL (500 mLs Intravenous New Bag/Given 03/30/23 1908)     IMPRESSION / MDM / ASSESSMENT AND PLAN / ED COURSE  I reviewed the triage vital signs and the nursing notes.                              77 y.o. female with past medical history of hypertension, CKD, GERD, pulmonary hypertension, and tracheobronchomalacia who presents to the ED complaining of dizziness and lightheadedness with low blood pressure noted at home.  Patient's presentation is most consistent with acute presentation with potential threat to life or bodily function.  Differential diagnosis includes, but is not limited to, arrhythmia, ACS, dehydration, electrode abnormality, AKI, anemia, sepsis, UTI, pneumonia, CHF.  Patient nontoxic-appearing and in no acute distress, vital signs remarkable for normal BP, however patient noted to have oxygen saturation of 89% on room air, placed on 2 L nasal cannula with improvement.  She was recently admitted to the hospital for similar respiratory failure, found to have diastolic dysfunction and diuresed with IV Lasix.  She currently denies any chest pain, EKG shows no evidence of arrhythmia or ischemia.  No symptoms to suggest infectious process.  We will further assess with labs, chest x-ray, and urinalysis.  Labs remarkable for AKI, no significant electrolyte abnormality noted and no significant anemia or leukocytosis.  LFTs are unremarkable, troponin within normal limits.  Chest x-ray negative for acute process, however patient noted to become whenever oxygen removed.  She had extensive workup for hypoxia during recent admission, including CTA that was negative for PE but did show  evidence of pulmonary hypertension.  Do not feel repeat CTA indicated at this time, no findings of recurrent CHF or COPD.  We will cautiously hydrate with IV fluids, case discussed with hospitalist for admission.      FINAL CLINICAL IMPRESSION(S) / ED DIAGNOSES   Final diagnoses:  Dizziness  AKI (acute kidney injury) (HCC)  Acute respiratory failure with hypoxia (HCC)     Rx / DC Orders   ED Discharge Orders     None        Note:  This document was prepared using Dragon voice recognition software and may include unintentional dictation errors.   Chesley Noon, MD 03/30/23 1946

## 2023-03-31 DIAGNOSIS — I9589 Other hypotension: Secondary | ICD-10-CM

## 2023-03-31 DIAGNOSIS — R55 Syncope and collapse: Secondary | ICD-10-CM | POA: Diagnosis not present

## 2023-03-31 DIAGNOSIS — R42 Dizziness and giddiness: Secondary | ICD-10-CM

## 2023-03-31 DIAGNOSIS — G4734 Idiopathic sleep related nonobstructive alveolar hypoventilation: Secondary | ICD-10-CM | POA: Diagnosis not present

## 2023-03-31 DIAGNOSIS — J9601 Acute respiratory failure with hypoxia: Secondary | ICD-10-CM

## 2023-03-31 DIAGNOSIS — I10 Essential (primary) hypertension: Secondary | ICD-10-CM

## 2023-03-31 DIAGNOSIS — I959 Hypotension, unspecified: Secondary | ICD-10-CM | POA: Insufficient documentation

## 2023-03-31 LAB — BASIC METABOLIC PANEL
Anion gap: 5 (ref 5–15)
BUN: 26 mg/dL — ABNORMAL HIGH (ref 8–23)
CO2: 33 mmol/L — ABNORMAL HIGH (ref 22–32)
Calcium: 8.6 mg/dL — ABNORMAL LOW (ref 8.9–10.3)
Chloride: 99 mmol/L (ref 98–111)
Creatinine, Ser: 1.6 mg/dL — ABNORMAL HIGH (ref 0.44–1.00)
GFR, Estimated: 34 mL/min — ABNORMAL LOW (ref >60)
Glucose, Bld: 98 mg/dL (ref 70–99)
Potassium: 3.9 mmol/L (ref 3.5–5.1)
Sodium: 137 mmol/L (ref 135–145)

## 2023-03-31 MED ORDER — ENOXAPARIN SODIUM 60 MG/0.6ML IJ SOSY: 60.0000 mg | PREFILLED_SYRINGE | INTRAMUSCULAR | Status: DC

## 2023-03-31 NOTE — Progress Notes (Signed)
CSW spoke with [facility] with [employer] who will start on patients home oxygen request.

## 2023-03-31 NOTE — Evaluation (Addendum)
Physical Therapy Evaluation Patient Details Name: Breanna Lawson MRN: [id number] DOB: 1946-03-19 Today's Date: 03/31/2023  History of Present Illness  BRIEANNE LAWE is a 77 y.o. female with medical history significant for HTN, CKD A, neuropathy on multiple related meds, morbid obesity, BMI over 50, frequent falls, diastolic CHF (EF 60 to 65% with G1 DD 03/28/2023), tracheobronchial malacia and pulmonary hypertension (on CTA chest 03/26/2026), recently hospitalized from 6/27 to 6/28 with respiratory failure of multifactorial etiology, discharged on room air with pulmonology follow-up,Who presents to the ED with dizziness on standing.  BP on the scene was 80s over 40s with O2 sat 90% on room air.  She was bolused by EMS with improvement in BP to 113/55 on arrival.  She was placed on O2 at 2 L by EMS.  ED course and data review: BP initially 113/55, dropping to 99/73, but again fluid responsive to 112/72 with otherwise normal vitals.  Troponin 8 and BNP 34.4.  Creatinine 1.83 above baseline of 1.2 in January 2024.  Serum bicarb 32.  Other labs at baseline.  EKG, personally viewed and interpreted showing sinus rhythm at 71 with no acute ST-T wave changes.  Chest x-ray nonacute  Patient treated with a 500 mL bolus in the ED.  Patient was ambulated in the ED with a drop in oxygen saturation to 81% on room air, improving to 92 to 95% with rest.  Clinical Impression  Pt received in bed with nurse and Daughter in law by her side.  Pt transferred to Telemetry monitor by nurse. Pt agreeable to PT interventions. Pt PLOF Ind at household level with FWW since past one year. As per pt and family, pt has help as needed  from neighbor and daughter for alternate day bathing and meals. 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 PT assessment revealed  pt needs sup with all activities and FWW for safety. Pt's SPO2 remained between 90 to 97% through out the 40 mins of evaluation at RA. Pt's O2 higher  at  rest. No LOB with the use of AD. Pt tires easily. BP before exertion 108/60 and after exertion 128/57. PT spent time educating pt and daughter in law to do PLB to maintain normal SPO2 at all times Pt and Family demonstrated good understanding. Pt in chair ar RA without distress and comfortable.  Pt will benefit from continue PT beyond Acute. PT will continue in Acute.      Recommendations for follow up therapy are one component of a multi-disciplinary discharge planning process, led by the attending physician.  Recommendations may be updated based on patient status, additional functional criteria and insurance authorization.  Follow Up Recommendations       Assistance Recommended at Discharge Intermittent Supervision/Assistance  Patient can return home with the following  A little help with walking and/or transfers;A little help with bathing/dressing/bathroom;Assistance with cooking/housework;Direct supervision/assist for medications management;Assist for transportation    Equipment Recommendations Rolling walker (2 wheels);BSC/3in1  Recommendations for Other Services       Functional Status Assessment Patient has had a recent decline in their functional status and demonstrates the ability to make significant improvements in function in a reasonable and predictable amount of time.     Precautions / Restrictions Precautions Precautions: Fall Restrictions Weight Bearing Restrictions: No      Mobility  Bed Mobility Overal bed mobility: Needs Assistance, Independent Bed Mobility: Supine to Sit     Supine to sit: Supervision, HOB elevated, Modified independent (  Device/Increase time)     General bed mobility comments: sat at the EOB without assist but slow.    Transfers Overall transfer level: Needs assistance Equipment used: Rolling walker (2 wheels) Transfers: Sit to/from Stand Sit to Stand: Supervision                Ambulation/Gait Ambulation/Gait assistance:  Supervision, Min guard Gait Distance (Feet): 40 Feet Assistive device: Rolling walker (2 wheels) Gait Pattern/deviations: Decreased step length - left, Decreased step length - right, Trunk flexed Gait velocity: decreased     General Gait Details: Ambulated to the batrhoom and back to chair and then again in the room with sup. with forward flexed posture.  Stairs: N/A            Wheelchair Mobility    Modified Rankin (Stroke Patients Only)       Balance Overall balance assessment: Independent Sitting-balance support: Feet supported, No upper extremity supported Sitting balance-Leahy Scale: Good     Standing balance support: Bilateral upper extremity supported, During functional activity, Reliant on assistive device for balance Standing balance-Leahy Scale: Good Standing balance comment: No LOB but tires easily.                             Pertinent Vitals/Pain Pain Assessment Pain Assessment: No/denies pain    Home Living Family/patient expects to be discharged to:: Private residence Living Arrangements: Alone Available Help at Discharge: Friend(s);Neighbor;Available PRN/intermittently Type of Home: Apartment Home Access: Level entry       Home Layout: One level Home Equipment: Rollator (4 wheels);Wheelchair - manual;Grab bars - tub/shower;Tub bench      Prior Function Prior Level of Function : Independent/Modified Independent (with intermittent supervision and for meals.)             Mobility Comments: Over past one year,  pt ambulates indepdently at household level using FWW. As per daughter in law,  pt has become weaker overpast 6 months. ADLs Comments: feeding with MOD I however neighbor will come over to meal prep, grooming with MOD I, wears slip on shoes, neighbor comes over while she showers; assist for IADLs     Hand Dominance   Dominant Hand: Right    Extremity/Trunk Assessment   Upper Extremity Assessment Upper Extremity  Assessment: Generalized weakness    Lower Extremity Assessment Lower Extremity Assessment: Generalized weakness       Communication   Communication: No difficulties  Cognition Arousal/Alertness: Awake/alert Behavior During Therapy: WFL for tasks assessed/performed Overall Cognitive Status: Within Functional Limits for tasks assessed                                          General Comments General comments (skin integrity, edema, etc.): SPO2  between 90 to 97% at RA    Exercises     Assessment/Plan    PT Assessment Patient needs continued PT services  PT Problem List Decreased strength;Decreased activity tolerance;Decreased balance;Cardiopulmonary status limiting activity       PT Treatment Interventions Gait training;Balance training;Therapeutic activities;Therapeutic exercise;Neuromuscular re-education;Patient/family education    PT Goals (Current goals can be found in the Care Plan section)  Acute Rehab PT Goals Patient Stated Goal: Walk better PT Goal Formulation: With patient Time For Goal Achievement: 04/07/23 Potential to Achieve Goals: Good    Frequency Min 2X/week     Co-evaluation  AM-PAC PT "6 Clicks" Mobility  Outcome Measure Help needed turning from your back to your side while in a flat bed without using bedrails?: None Help needed moving from lying on your back to sitting on the side of a flat bed without using bedrails?: None Help needed moving to and from a bed to a chair (including a wheelchair)?: A Little Help needed standing up from a chair using your arms (e.g., wheelchair or bedside chair)?: A Little Help needed to walk in hospital room?: A Little Help needed climbing 3-5 steps with a railing? : A Lot 6 Click Score: 19    End of Session Equipment Utilized During Treatment: Gait belt Activity Tolerance: Patient tolerated treatment well;Patient limited by fatigue Patient left: in chair;with call  bell/phone within reach;with chair alarm set;with family/visitor present Nurse Communication: Mobility status PT Visit Diagnosis: Unsteadiness on feet (R26.81);Repeated falls (R29.6);Muscle weakness (generalized) (M62.81)    Time: 6045-4098 PT Time Calculation (min) (ACUTE ONLY): 44 min   Charges:   PT Evaluation $PT Eval Low Complexity: 1 Low PT Treatments $Gait Training: 8-22 mins $Therapeutic Activity: 8-22 mins       Shantanu Strauch PT DPT 12:04 PM,03/31/23  Janet Berlin PT DPT 12:09 PM,03/31/23

## 2023-03-31 NOTE — Discharge Summary (Addendum)
Physician Discharge Summary   Patient: Breanna Lawson MRN: [id number] DOB: 1946/08/30  Admit date:     03/30/2023  Discharge date: 03/31/23  Discharge Physician: Marrion Coy   PCP: Patrice Paradise, MD   Recommendations at discharge:   Follow-up with PCP in 1 week. Follow-up with pulmonology as a previous scheduled for sleep study and pulmonary function test.  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 Chronic kidney disease stage IIIb. Resolved Problems:   * No resolved hospital problems. *  Hospital Course: Breanna Lawson is a 77 y.o. female with medical history significant for HTN, CKD A, neuropathy on multiple related meds, morbid obesity, BMI over 50, frequent falls, diastolic CHF (EF 60 to 65% with G1 DD 03/28/2023), tracheobronchial malacia and pulmonary hypertension (on CTA chest 03/26/2026), recently hospitalized from 6/27 to 6/28 with respiratory failure of multifactorial etiology, discharged on room air with pulmonology follow-up,Who presents to the ED with dizziness on standing.  BP on the scene was 80s over 40s with O2 sat 90% on room air.  She was bolused by EMS with improvement in BP to 113/55 on arrival.  She was placed on O2 at 2 L by EMS. ED course and data review: BP initially 113/55, dropping to 99/73, but again fluid responsive to 112/72 with otherwise normal vitals.  Troponin 8 and BNP 34.4.  Creatinine 1.83 above baseline of 1.2 in January 2024.  Serum bicarb 32.  Other labs at baseline. EKG,  personally viewed and interpreted showing sinus rhythm at 71 with no acute ST-T wave changes. Chest x-ray nonacute Patient treated with a 500 mL bolus in the ED. Patient was ambulated in the ED with a drop in oxygen saturation to 81% on room air, improving to 92 to 95% with rest.  Patient was monitored overnight, blood pressure has been normalized after holding blood pressure medicines.  Overnight oximetry showed hypoxia, 2 L oxygen will be prescribed for night use.  This most likely due to obstructive sleep apnea for which patient be followed by pulmonology to schedule sleep study.   Assessment and Plan: * Acute on chronic respiratory failure with hypoxia and hypercapnia (HCC) Tracheomalacia/pulmonary hypertension on CT 03/27/2023 (not on echo 03/28/2023)/suspect OSA/OHS Morbid obesity, BMI over 50 Patient desats to low 80s with ambulation.  Second admission in a few days with similar finding Prior CT scan showed significant pulm hypertension, but echocardiogram showed ejection fraction 60 to 65%, unable to measure pulmonary blood pressure. Patient most likely has obstructive sleep apnea, she had overnight oximetry, documented hypoxemia, patient had 18.5% time with oxygen saturation between 80 to 89%.  Mostly below 88%.  Oxygen will be prescribed. Patient currently doing better, oxygen saturation 98% on room air.  Medically stable to be discharged back to home health.   Postural dizziness with presyncope Hypotension. History of falls This is due to blood pressure medicine with pulm hypertension.  Patient blood pressure not elevated by holding blood pressure medicines.  Continue hold blood pressure medicines until seen by PCP.  Also hold diuretics. Symptom has improved.  Acute kidney injury ruled out. Chronic kidney disease stage IIIb. Reviewed lab results, condition not consistent with acute kidney injury.  GFR less than 45 chronically.  This has been relatively stable.  Follow-up with PCP  as outpatient.  Chronic diastolic CHF (congestive heart failure) (HCC) Clinically euvolemic, chest x-ray clear and BNP in the 30s Echo from 6/27 with EF 60 to 65% and G1 DD Hold diuretics  Neuropathy Resume home medicines.  Hypertension Blood pressure medicine has been on hold, blood pressure not significant elevated.  Patient will be followed by PCP to determine if blood pressure medicine can be restarted.       Consultants: None Procedures performed: None  Disposition: Home health Diet recommendation:  Discharge Diet Orders (From admission, onward)     Start     Ordered   03/31/23 0000  Diet - low sodium heart healthy        03/31/23 0953           Cardiac diet DISCHARGE MEDICATION: Allergies as of 03/31/2023   No Known Allergies      Medication List     STOP taking these medications    amLODipine 5 MG tablet Commonly known as: NORVASC   losartan 100 MG tablet Commonly known as: COZAAR       TAKE these medications    allopurinol 100 MG tablet Commonly known as: ZYLOPRIM Take 100 mg by mouth daily.   furosemide 20 MG tablet Commonly known as: LASIX Take 20 mg by mouth daily as needed for fluid or edema.   nortriptyline 50 MG capsule Commonly known as: PAMELOR Take 50 mg by mouth at bedtime.   nortriptyline 10 MG capsule Commonly known as: PAMELOR Take 10 mg by mouth every morning.   pantoprazole 40 MG tablet Commonly known as: PROTONIX Take 40 mg by mouth daily.   pregabalin 200 MG capsule Commonly known as: LYRICA Take 200 mg by mouth 2 (two) times daily.   traMADol 50 MG tablet Commonly known as: ULTRAM Take 50 mg by mouth daily.        Follow-up Information     Patrice Paradise, MD Follow up in 1 week(s).   Specialty: Physician Assistant Contact information: [phone] [facility] [address] [facility] [place] [address] [phone]         Vida Rigger, MD Follow up in 1 month(s).   Specialty: Pulmonary  Disease Contact information: [address] [place] [address] [phone]                Discharge Exam: Ceasar Mons Weights   03/30/23 1553 03/31/23 0349  Weight: 127.2 kg 127.1 kg   General exam: Appears calm and comfortable, morbid obese  Respiratory system: Clear to auscultation. Respiratory effort normal. Cardiovascular system: S1 & S2 heard, RRR. No JVD, murmurs, rubs, gallops or clicks. No pedal edema. Gastrointestinal system: Abdomen is nondistended, soft and nontender. No organomegaly or masses felt. Normal bowel sounds heard. Central nervous system: Alert and oriented. No focal neurological deficits. Extremities: Symmetric 5 x 5 power. Skin: No rashes, lesions or ulcers Psychiatry: Judgement and insight appear normal. Mood & affect appropriate.    Condition at discharge: fair  The results of significant diagnostics from this hospitalization (including imaging, microbiology, ancillary and laboratory) are listed below for reference.   Imaging Studies: DG Chest 2 View  Result Date: 03/30/2023 CLINICAL DATA:  Weakness and shortness of breath EXAM: CHEST - 2 VIEW COMPARISON:  Chest x-ray March 27, 2023, chest CT  angiogram March 27, 2023 FINDINGS: The cardiomediastinal silhouette is unchanged in contour. Low lung volumes with bronchovascular crowding. No focal pulmonary opacity. No pleural effusion or pneumothorax. The visualized upper abdomen is unremarkable. No acute osseous abnormality. IMPRESSION: No acute cardiopulmonary abnormality. Electronically Signed   By: Jacob Moores M.D.   On: 03/30/2023 17:31   ECHOCARDIOGRAM COMPLETE  Result Date: 03/28/2023    ECHOCARDIOGRAM REPORT   Patient Name:   Breanna Lawson Date of Exam: 03/28/2023 Medical Rec #:  [id number]      Height:       62.0 in Accession #:    [id number]     Weight:       280.4 lb Date of Birth:  1946/08/26     BSA:          2.207 m Patient Age:    76 years       BP:           122/69 mmHg Patient  Gender: F              HR:           79 bpm. Exam Location:  [facility] Procedure: 2D Echo, Color Doppler and Cardiac Doppler Indications:     Syncope  History:         Patient has no prior history of Echocardiogram examinations.                  Pulmonary HTN; Risk Factors:Hypertension.  Sonographer:     Mikki Harbor Referring Phys:  [id number] Andris Baumann Diagnosing Phys: Lorine Bears MD  Sonographer Comments: Technically difficult study due to poor echo windows and patient is obese. IMPRESSIONS  1. Left ventricular ejection fraction, by estimation, is 60 to 65%. The left ventricle has normal function. The left ventricle has no regional wall motion abnormalities. Left ventricular diastolic parameters are consistent with Grade I diastolic dysfunction (impaired relaxation).  2. Right ventricular systolic function is normal. The right ventricular size is normal. Tricuspid regurgitation signal is inadequate for assessing PA pressure.  3. The mitral valve is normal in structure. No evidence of mitral valve regurgitation. No evidence of mitral stenosis.  4. The aortic valve is normal in structure. Aortic valve regurgitation is not visualized. Mild aortic valve stenosis. Aortic valve mean gradient measures 6.0 mmHg. Aortic valve Vmax measures 1.90 m/s.  5. The inferior vena cava is normal in size with greater than 50% respiratory variability, suggesting right atrial pressure of 3 mmHg. FINDINGS  Left Ventricle: Left ventricular ejection fraction, by estimation, is 60 to 65%. The left ventricle has normal function. The left ventricle has no regional wall motion abnormalities. The left ventricular internal cavity size was normal in size. There is  borderline left ventricular hypertrophy. Left ventricular diastolic parameters are consistent with Grade I diastolic dysfunction (impaired relaxation). Right Ventricle: The right ventricular size is normal. No increase in right ventricular wall thickness. Right ventricular  systolic function is normal. Tricuspid regurgitation signal is inadequate for assessing PA pressure. The tricuspid regurgitant velocity is 1.89 m/s, and with an assumed right atrial pressure of 3 mmHg, the estimated right ventricular systolic pressure is 17.3 mmHg. Left Atrium: Left atrial size was normal in size. Right Atrium: Right atrial size was normal in size. Pericardium: There is no evidence of pericardial effusion. Mitral Valve: The mitral valve is normal in structure. No evidence of mitral valve regurgitation. No evidence of mitral valve stenosis. MV peak gradient, 4.2 mmHg. The mean mitral valve  gradient is 2.0 mmHg. Tricuspid Valve: The tricuspid valve is normal in structure. Tricuspid valve regurgitation is not demonstrated. No evidence of tricuspid stenosis. Aortic Valve: The aortic valve is normal in structure. Aortic valve regurgitation is not visualized. Mild aortic stenosis is present. Aortic valve mean gradient measures 6.0 mmHg. Aortic valve peak gradient measures 14.4 mmHg. Aortic valve area, by VTI measures 2.50 cm. Pulmonic Valve: The pulmonic valve was normal in structure. Pulmonic valve regurgitation is not visualized. No evidence of pulmonic stenosis. Aorta: The aortic root is normal in size and structure. Venous: The inferior vena cava is normal in size with greater than 50% respiratory variability, suggesting right atrial pressure of 3 mmHg. IAS/Shunts: No atrial level shunt detected by color flow Doppler.  LEFT VENTRICLE PLAX 2D LVIDd:         4.00 cm   Diastology LVIDs:         2.80 cm   LV e' medial:    10.20 cm/s LV PW:         1.10 cm   LV E/e' medial:  6.9 LV IVS:        1.20 cm   LV e' lateral:   7.62 cm/s LVOT diam:     1.90 cm   LV E/e' lateral: 9.2 LV SV:         77 LV SV Index:   35 LVOT Area:     2.84 cm  RIGHT VENTRICLE RV Basal diam:  3.45 cm RV Mid diam:    2.50 cm LEFT ATRIUM             Index        RIGHT ATRIUM           Index LA diam:        3.60 cm 1.63 cm/m   RA  Area:     19.60 cm LA Vol (A2C):   43.1 ml 19.53 ml/m  RA Volume:   53.30 ml  24.15 ml/m LA Vol (A4C):   37.9 ml 17.17 ml/m LA Biplane Vol: 41.4 ml 18.76 ml/m  AORTIC VALVE                     PULMONIC VALVE AV Area (Vmax):    1.90 cm      PV Vmax:       1.50 m/s AV Area (Vmean):   2.02 cm      PV Peak grad:  9.0 mmHg AV Area (VTI):     2.50 cm AV Vmax:           190.00 cm/s AV Vmean:          110.000 cm/s AV VTI:            0.309 m AV Peak Grad:      14.4 mmHg AV Mean Grad:      6.0 mmHg LVOT Vmax:         127.00 cm/s LVOT Vmean:        78.400 cm/s LVOT VTI:          0.272 m LVOT/AV VTI ratio: 0.88  AORTA Ao Root diam: 3.20 cm MITRAL VALVE                TRICUSPID VALVE MV Area (PHT): 2.97 cm     TR Peak grad:   14.3 mmHg MV Area VTI:   2.04 cm     TR Vmax:        189.00 cm/s MV Peak  grad:  4.2 mmHg MV Mean grad:  2.0 mmHg     SHUNTS MV Vmax:       1.03 m/s     Systemic VTI:  0.27 m MV Vmean:      59.8 cm/s    Systemic Diam: 1.90 cm MV Decel Time: 255 msec MV E velocity: 70.20 cm/s MV A velocity: 102.00 cm/s MV E/A ratio:  0.69 Lorine Bears MD Electronically signed by Lorine Bears MD Signature Date/Time: 03/28/2023/1:55:07 PM    Final    CT Angio Chest PE W and/or Wo Contrast  Result Date: 03/27/2023 CLINICAL DATA:  Pulmonary embolism (PE) suspected, high prob Fall at home. EXAM: CT ANGIOGRAPHY CHEST WITH CONTRAST TECHNIQUE: Multidetector CT imaging of the chest was performed using the standard protocol during bolus administration of intravenous contrast. Multiplanar CT image reconstructions and MIPs were obtained to evaluate the vascular anatomy. RADIATION DOSE REDUCTION: This exam was performed according to the departmental dose-optimization program which includes automated exposure control, adjustment of the mA and/or kV according to patient size and/or use of iterative reconstruction technique. CONTRAST:  60mL OMNIPAQUE IOHEXOL 350 MG/ML SOLN COMPARISON:  Radiograph earlier today. FINDINGS:  Cardiovascular: There are no filling defects within the pulmonary arteries to suggest pulmonary embolus. Main pulmonary artery is dilated at 3.1 cm. The heart is enlarged. Aortic atherosclerosis. No acute aortic findings. No pericardial effusion. Mediastinum/Nodes: No adenopathy or mass. Decompressed esophagus. No suspicious thyroid nodule. Lungs/Pleura: Subsegmental atelectasis in the lower lobes. 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 There is mild central bronchial thickening. In no focal airspace disease. No pleural effusion. Upper Abdomen: Enlargement of the liver and spleen. Nodular hepatic contours suggestive of cirrhosis. Cholecystectomy. Musculoskeletal: Remote right rib fractures with incomplete surrounding callus formation. Healed left rib fractures. Diffuse thoracic degenerative change. Scattered calcified Schmorl's nodes. No acute osseous abnormalities are seen. Review of the MIP images confirms the above findings. IMPRESSION: 1. No pulmonary embolus. 2. Cardiomegaly. Dilated main pulmonary artery suggesting pulmonary arterial hypertension. 3. 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 Mild central bronchial thickening. 4. Nodular hepatic contours suggestive of cirrhosis. Recommend correlation for cirrhosis risk factors. Electronically Signed   By: Narda Rutherford M.D.   On: 03/27/2023 03:03   DG Chest 2 View  Result Date: 03/27/2023 CLINICAL DATA:  Status post fall. EXAM: CHEST - 2 VIEW COMPARISON:  September 14, 2011 FINDINGS: There is mild to moderate severity enlargement of the cardiac silhouette. Mild, diffusely increased interstitial lung markings are seen. Mild right infrahilar atelectasis is noted. There is no evidence of focal consolidation, pleural effusion or pneumothorax. Multilevel degenerative changes are seen throughout the thoracic spine. IMPRESSION: Cardiomegaly with mild interstitial edema and mild right infrahilar atelectasis. Electronically Signed    By: Aram Candela M.D.   On: 03/27/2023 00:41   MM 3D SCREENING MAMMOGRAM BILATERAL BREAST  Result Date: 03/19/2023 CLINICAL DATA:  Screening. EXAM: DIGITAL SCREENING BILATERAL MAMMOGRAM WITH TOMOSYNTHESIS AND CAD TECHNIQUE: Bilateral screening digital craniocaudal and mediolateral oblique mammograms were obtained. Bilateral screening digital breast tomosynthesis was performed. The images were evaluated with computer-aided detection. COMPARISON:  Previous exam(s). ACR Breast Density Category b: There are scattered areas of fibroglandular density. FINDINGS: There are no findings suspicious for malignancy. IMPRESSION: No mammographic evidence of malignancy. A result letter of this screening mammogram will be mailed directly to the patient. RECOMMENDATION: Screening mammogram in one year. (Code:[id number]-B-01Y) BI-RADS CATEGORY  1: Negative. Electronically Signed   By: Harmon Pier M.D.   On: 03/19/2023 07:20  Microbiology: Results for orders placed or performed during the hospital encounter of 03/26/23  SARS Coronavirus 2 by RT PCR (hospital order, performed in [facility] hospital lab) *cepheid single result test* Anterior Nasal Swab     Status: None   Collection Time: 03/26/23 11:48 PM   Specimen: Anterior Nasal Swab  Result Value Ref Range Status   SARS Coronavirus 2 by RT PCR NEGATIVE NEGATIVE Final    Comment: (NOTE) SARS-CoV-2 target nucleic acids are NOT DETECTED.  The SARS-CoV-2 RNA is generally detectable in upper and lower respiratory specimens during the acute phase of infection. The lowest concentration of SARS-CoV-2 viral copies this assay can detect is 250 copies / mL. A negative result does not preclude SARS-CoV-2 infection and should not be used as the sole basis for treatment or other patient management decisions.  A negative result may occur with improper specimen collection / handling, submission of specimen other than nasopharyngeal swab, presence of viral mutation(s) within  the areas targeted by this assay, and inadequate number of viral copies (<250 copies / mL). A negative result must be combined with clinical observations, patient history, and epidemiological information.  Fact Sheet for Patients:   RoadLapTop.co.za  Fact Sheet for Healthcare Providers: http://kim-miller.com/  This test is not yet approved or  cleared by the [place] FDA and has been authorized for detection and/or diagnosis of SARS-CoV-2 by FDA under an Emergency Use Authorization (EUA).  This EUA will remain in effect (meaning this test can be used) for the duration of the COVID-19 declaration under Section 564(b)(1) of the Act, 21 U.S.C. section 360bbb-3(b)(1), unless the authorization is terminated or revoked sooner.  Performed at [facility], [address] [place], [place] [address]     Labs: CBC: Recent Labs  Lab 03/26/23 1704 03/27/23 0659 03/28/23 0458 03/30/23 1608  WBC 10.1 9.3 7.9 8.8  NEUTROABS 8.1*  --   --  6.4  HGB 12.0 11.6* 11.6* 11.8*  HCT 39.9 39.5 39.0 39.2  MCV 86.0 87.0 86.7 86.2  PLT 267 262 256 245   Basic Metabolic Panel: Recent Labs  Lab 03/26/23 1704 03/27/23 0659 03/28/23 0458 03/30/23 1608 03/31/23 0446  NA 134*  --  137 138 137  K 4.6  --  4.2 3.8 3.9  CL 95*  --  95* 97* 99  CO2 29  --  36* 32 33*  GLUCOSE 107*  --  99 91 98  BUN 25*  --  24* 31* 26*  CREATININE 1.45* 1.43* 1.45* 1.83* 1.60*  CALCIUM 9.1  --  9.0 9.3 8.6*   Liver Function Tests: Recent Labs  Lab 03/30/23 1608  AST 23  ALT 14  ALKPHOS 90  BILITOT 0.2*  PROT 6.6  ALBUMIN 3.3*   CBG: No results for input(s): "GLUCAP" in the last 168 hours.  Discharge time spent: greater than 30 minutes.  Signed: Marrion Coy, MD [facility] 03/31/2023

## 2023-03-31 NOTE — Progress Notes (Deleted)
Patient complaining of chest pain after walking for oxygen evaluation.  Will get EKG and troponin.  Hold off discharge.

## 2023-04-01 DIAGNOSIS — R296 Repeated falls: Secondary | ICD-10-CM | POA: Diagnosis not present

## 2023-04-01 DIAGNOSIS — G629 Polyneuropathy, unspecified: Secondary | ICD-10-CM | POA: Diagnosis not present

## 2023-04-01 DIAGNOSIS — Z6841 Body Mass Index (BMI) 40.0 and over, adult: Secondary | ICD-10-CM | POA: Diagnosis not present

## 2023-04-01 DIAGNOSIS — J9622 Acute and chronic respiratory failure with hypercapnia: Secondary | ICD-10-CM | POA: Diagnosis not present

## 2023-04-01 DIAGNOSIS — I130 Hypertensive heart and chronic kidney disease with heart failure and stage 1 through stage 4 chronic kidney disease, or unspecified chronic kidney disease: Secondary | ICD-10-CM | POA: Diagnosis not present

## 2023-04-01 DIAGNOSIS — N179 Acute kidney failure, unspecified: Secondary | ICD-10-CM | POA: Diagnosis not present

## 2023-04-01 DIAGNOSIS — I5032 Chronic diastolic (congestive) heart failure: Secondary | ICD-10-CM | POA: Diagnosis not present

## 2023-04-01 DIAGNOSIS — N1832 Chronic kidney disease, stage 3b: Secondary | ICD-10-CM | POA: Diagnosis not present

## 2023-04-01 DIAGNOSIS — J9621 Acute and chronic respiratory failure with hypoxia: Secondary | ICD-10-CM | POA: Diagnosis not present

## 2023-04-01 DIAGNOSIS — I1310 Hypertensive heart and chronic kidney disease without heart failure, with stage 1 through stage 4 chronic kidney disease, or unspecified chronic kidney disease: Secondary | ICD-10-CM | POA: Diagnosis not present

## 2023-04-01 DIAGNOSIS — N1831 Chronic kidney disease, stage 3a: Secondary | ICD-10-CM | POA: Diagnosis not present

## 2023-04-02 DIAGNOSIS — Z6841 Body Mass Index (BMI) 40.0 and over, adult: Secondary | ICD-10-CM | POA: Diagnosis not present

## 2023-04-02 DIAGNOSIS — I5032 Chronic diastolic (congestive) heart failure: Secondary | ICD-10-CM | POA: Diagnosis not present

## 2023-04-02 DIAGNOSIS — J9621 Acute and chronic respiratory failure with hypoxia: Secondary | ICD-10-CM | POA: Diagnosis not present

## 2023-04-02 DIAGNOSIS — I130 Hypertensive heart and chronic kidney disease with heart failure and stage 1 through stage 4 chronic kidney disease, or unspecified chronic kidney disease: Secondary | ICD-10-CM | POA: Diagnosis not present

## 2023-04-02 DIAGNOSIS — N179 Acute kidney failure, unspecified: Secondary | ICD-10-CM | POA: Diagnosis not present

## 2023-04-02 DIAGNOSIS — N1832 Chronic kidney disease, stage 3b: Secondary | ICD-10-CM | POA: Diagnosis not present

## 2023-04-02 DIAGNOSIS — G629 Polyneuropathy, unspecified: Secondary | ICD-10-CM | POA: Diagnosis not present

## 2023-04-02 DIAGNOSIS — J9622 Acute and chronic respiratory failure with hypercapnia: Secondary | ICD-10-CM | POA: Diagnosis not present

## 2023-04-09 ENCOUNTER — Telehealth: Payer: Self-pay | Admitting: Internal Medicine

## 2023-04-09 DIAGNOSIS — N1832 Chronic kidney disease, stage 3b: Secondary | ICD-10-CM | POA: Diagnosis not present

## 2023-04-09 DIAGNOSIS — J9621 Acute and chronic respiratory failure with hypoxia: Secondary | ICD-10-CM | POA: Diagnosis not present

## 2023-04-09 DIAGNOSIS — Z6841 Body Mass Index (BMI) 40.0 and over, adult: Secondary | ICD-10-CM | POA: Diagnosis not present

## 2023-04-09 DIAGNOSIS — G629 Polyneuropathy, unspecified: Secondary | ICD-10-CM | POA: Diagnosis not present

## 2023-04-09 DIAGNOSIS — I5032 Chronic diastolic (congestive) heart failure: Secondary | ICD-10-CM | POA: Diagnosis not present

## 2023-04-09 DIAGNOSIS — N179 Acute kidney failure, unspecified: Secondary | ICD-10-CM | POA: Diagnosis not present

## 2023-04-09 DIAGNOSIS — J9622 Acute and chronic respiratory failure with hypercapnia: Secondary | ICD-10-CM | POA: Diagnosis not present

## 2023-04-09 DIAGNOSIS — I130 Hypertensive heart and chronic kidney disease with heart failure and stage 1 through stage 4 chronic kidney disease, or unspecified chronic kidney disease: Secondary | ICD-10-CM | POA: Diagnosis not present

## 2023-04-09 NOTE — Telephone Encounter (Signed)
Pt wants to know the difference of the sleep apnea and oxygen. Pt is cancelling appt

## 2023-04-09 NOTE — Telephone Encounter (Signed)
Patient has not been in our office nor does she have a pending appt.   I spoke with patient. She has questions regarding OSA and oxygen. I made her aware that an appt is needed to discuss all questions or concerns. I offered an appt and she declined. She stated that she would prefer to see cardiology first and then call back to schedule an appointment.  Nothing further needed.

## 2023-04-10 DIAGNOSIS — R6889 Other general symptoms and signs: Secondary | ICD-10-CM | POA: Diagnosis not present

## 2023-04-10 DIAGNOSIS — M170 Bilateral primary osteoarthritis of knee: Secondary | ICD-10-CM | POA: Diagnosis not present

## 2023-04-10 DIAGNOSIS — G8929 Other chronic pain: Secondary | ICD-10-CM | POA: Diagnosis not present

## 2023-04-10 DIAGNOSIS — M25462 Effusion, left knee: Secondary | ICD-10-CM | POA: Diagnosis not present

## 2023-04-10 DIAGNOSIS — M25461 Effusion, right knee: Secondary | ICD-10-CM | POA: Diagnosis not present

## 2023-04-11 DIAGNOSIS — G4733 Obstructive sleep apnea (adult) (pediatric): Secondary | ICD-10-CM | POA: Diagnosis not present

## 2023-04-11 DIAGNOSIS — Z6841 Body Mass Index (BMI) 40.0 and over, adult: Secondary | ICD-10-CM | POA: Diagnosis not present

## 2023-04-11 DIAGNOSIS — I959 Hypotension, unspecified: Secondary | ICD-10-CM | POA: Diagnosis not present

## 2023-04-11 DIAGNOSIS — I272 Pulmonary hypertension, unspecified: Secondary | ICD-10-CM | POA: Diagnosis not present

## 2023-04-12 DIAGNOSIS — N1832 Chronic kidney disease, stage 3b: Secondary | ICD-10-CM | POA: Diagnosis not present

## 2023-04-12 DIAGNOSIS — Z6841 Body Mass Index (BMI) 40.0 and over, adult: Secondary | ICD-10-CM | POA: Diagnosis not present

## 2023-04-12 DIAGNOSIS — I5032 Chronic diastolic (congestive) heart failure: Secondary | ICD-10-CM | POA: Diagnosis not present

## 2023-04-12 DIAGNOSIS — I130 Hypertensive heart and chronic kidney disease with heart failure and stage 1 through stage 4 chronic kidney disease, or unspecified chronic kidney disease: Secondary | ICD-10-CM | POA: Diagnosis not present

## 2023-04-12 DIAGNOSIS — J9621 Acute and chronic respiratory failure with hypoxia: Secondary | ICD-10-CM | POA: Diagnosis not present

## 2023-04-12 DIAGNOSIS — N179 Acute kidney failure, unspecified: Secondary | ICD-10-CM | POA: Diagnosis not present

## 2023-04-12 DIAGNOSIS — G629 Polyneuropathy, unspecified: Secondary | ICD-10-CM | POA: Diagnosis not present

## 2023-04-12 DIAGNOSIS — J9622 Acute and chronic respiratory failure with hypercapnia: Secondary | ICD-10-CM | POA: Diagnosis not present

## 2023-04-15 ENCOUNTER — Institutional Professional Consult (permissible substitution): Payer: Medicare HMO | Admitting: Internal Medicine

## 2023-04-15 DIAGNOSIS — J9621 Acute and chronic respiratory failure with hypoxia: Secondary | ICD-10-CM | POA: Diagnosis not present

## 2023-04-15 DIAGNOSIS — I5032 Chronic diastolic (congestive) heart failure: Secondary | ICD-10-CM | POA: Diagnosis not present

## 2023-04-15 DIAGNOSIS — G629 Polyneuropathy, unspecified: Secondary | ICD-10-CM | POA: Diagnosis not present

## 2023-04-15 DIAGNOSIS — I130 Hypertensive heart and chronic kidney disease with heart failure and stage 1 through stage 4 chronic kidney disease, or unspecified chronic kidney disease: Secondary | ICD-10-CM | POA: Diagnosis not present

## 2023-04-15 DIAGNOSIS — N1832 Chronic kidney disease, stage 3b: Secondary | ICD-10-CM | POA: Diagnosis not present

## 2023-04-15 DIAGNOSIS — Z6841 Body Mass Index (BMI) 40.0 and over, adult: Secondary | ICD-10-CM | POA: Diagnosis not present

## 2023-04-15 DIAGNOSIS — J9622 Acute and chronic respiratory failure with hypercapnia: Secondary | ICD-10-CM | POA: Diagnosis not present

## 2023-04-15 DIAGNOSIS — N179 Acute kidney failure, unspecified: Secondary | ICD-10-CM | POA: Diagnosis not present

## 2023-04-16 DIAGNOSIS — N179 Acute kidney failure, unspecified: Secondary | ICD-10-CM | POA: Diagnosis not present

## 2023-04-16 DIAGNOSIS — N1832 Chronic kidney disease, stage 3b: Secondary | ICD-10-CM | POA: Diagnosis not present

## 2023-04-16 DIAGNOSIS — Z6841 Body Mass Index (BMI) 40.0 and over, adult: Secondary | ICD-10-CM | POA: Diagnosis not present

## 2023-04-16 DIAGNOSIS — G629 Polyneuropathy, unspecified: Secondary | ICD-10-CM | POA: Diagnosis not present

## 2023-04-16 DIAGNOSIS — I5032 Chronic diastolic (congestive) heart failure: Secondary | ICD-10-CM | POA: Diagnosis not present

## 2023-04-16 DIAGNOSIS — J9621 Acute and chronic respiratory failure with hypoxia: Secondary | ICD-10-CM | POA: Diagnosis not present

## 2023-04-16 DIAGNOSIS — I130 Hypertensive heart and chronic kidney disease with heart failure and stage 1 through stage 4 chronic kidney disease, or unspecified chronic kidney disease: Secondary | ICD-10-CM | POA: Diagnosis not present

## 2023-04-16 DIAGNOSIS — J9622 Acute and chronic respiratory failure with hypercapnia: Secondary | ICD-10-CM | POA: Diagnosis not present

## 2023-04-17 DIAGNOSIS — J9622 Acute and chronic respiratory failure with hypercapnia: Secondary | ICD-10-CM | POA: Diagnosis not present

## 2023-04-17 DIAGNOSIS — N1832 Chronic kidney disease, stage 3b: Secondary | ICD-10-CM | POA: Diagnosis not present

## 2023-04-17 DIAGNOSIS — J9621 Acute and chronic respiratory failure with hypoxia: Secondary | ICD-10-CM | POA: Diagnosis not present

## 2023-04-17 DIAGNOSIS — Z6841 Body Mass Index (BMI) 40.0 and over, adult: Secondary | ICD-10-CM | POA: Diagnosis not present

## 2023-04-17 DIAGNOSIS — N179 Acute kidney failure, unspecified: Secondary | ICD-10-CM | POA: Diagnosis not present

## 2023-04-17 DIAGNOSIS — I5032 Chronic diastolic (congestive) heart failure: Secondary | ICD-10-CM | POA: Diagnosis not present

## 2023-04-17 DIAGNOSIS — G629 Polyneuropathy, unspecified: Secondary | ICD-10-CM | POA: Diagnosis not present

## 2023-04-17 DIAGNOSIS — I130 Hypertensive heart and chronic kidney disease with heart failure and stage 1 through stage 4 chronic kidney disease, or unspecified chronic kidney disease: Secondary | ICD-10-CM | POA: Diagnosis not present

## 2023-04-18 DIAGNOSIS — J9622 Acute and chronic respiratory failure with hypercapnia: Secondary | ICD-10-CM | POA: Diagnosis not present

## 2023-04-18 DIAGNOSIS — G629 Polyneuropathy, unspecified: Secondary | ICD-10-CM | POA: Diagnosis not present

## 2023-04-18 DIAGNOSIS — N1832 Chronic kidney disease, stage 3b: Secondary | ICD-10-CM | POA: Diagnosis not present

## 2023-04-18 DIAGNOSIS — J9621 Acute and chronic respiratory failure with hypoxia: Secondary | ICD-10-CM | POA: Diagnosis not present

## 2023-04-18 DIAGNOSIS — I5032 Chronic diastolic (congestive) heart failure: Secondary | ICD-10-CM | POA: Diagnosis not present

## 2023-04-18 DIAGNOSIS — Z6841 Body Mass Index (BMI) 40.0 and over, adult: Secondary | ICD-10-CM | POA: Diagnosis not present

## 2023-04-18 DIAGNOSIS — I130 Hypertensive heart and chronic kidney disease with heart failure and stage 1 through stage 4 chronic kidney disease, or unspecified chronic kidney disease: Secondary | ICD-10-CM | POA: Diagnosis not present

## 2023-04-18 DIAGNOSIS — N179 Acute kidney failure, unspecified: Secondary | ICD-10-CM | POA: Diagnosis not present

## 2023-04-19 DIAGNOSIS — M5489 Other dorsalgia: Secondary | ICD-10-CM | POA: Diagnosis not present

## 2023-04-19 DIAGNOSIS — G629 Polyneuropathy, unspecified: Secondary | ICD-10-CM | POA: Diagnosis not present

## 2023-04-22 DIAGNOSIS — J9621 Acute and chronic respiratory failure with hypoxia: Secondary | ICD-10-CM | POA: Diagnosis not present

## 2023-04-22 DIAGNOSIS — J9622 Acute and chronic respiratory failure with hypercapnia: Secondary | ICD-10-CM | POA: Diagnosis not present

## 2023-04-22 DIAGNOSIS — N179 Acute kidney failure, unspecified: Secondary | ICD-10-CM | POA: Diagnosis not present

## 2023-04-22 DIAGNOSIS — I5032 Chronic diastolic (congestive) heart failure: Secondary | ICD-10-CM | POA: Diagnosis not present

## 2023-04-22 DIAGNOSIS — Z6841 Body Mass Index (BMI) 40.0 and over, adult: Secondary | ICD-10-CM | POA: Diagnosis not present

## 2023-04-22 DIAGNOSIS — G629 Polyneuropathy, unspecified: Secondary | ICD-10-CM | POA: Diagnosis not present

## 2023-04-22 DIAGNOSIS — N1832 Chronic kidney disease, stage 3b: Secondary | ICD-10-CM | POA: Diagnosis not present

## 2023-04-22 DIAGNOSIS — I130 Hypertensive heart and chronic kidney disease with heart failure and stage 1 through stage 4 chronic kidney disease, or unspecified chronic kidney disease: Secondary | ICD-10-CM | POA: Diagnosis not present

## 2023-04-23 DIAGNOSIS — I130 Hypertensive heart and chronic kidney disease with heart failure and stage 1 through stage 4 chronic kidney disease, or unspecified chronic kidney disease: Secondary | ICD-10-CM | POA: Diagnosis not present

## 2023-04-23 DIAGNOSIS — N179 Acute kidney failure, unspecified: Secondary | ICD-10-CM | POA: Diagnosis not present

## 2023-04-23 DIAGNOSIS — Z6841 Body Mass Index (BMI) 40.0 and over, adult: Secondary | ICD-10-CM | POA: Diagnosis not present

## 2023-04-23 DIAGNOSIS — G629 Polyneuropathy, unspecified: Secondary | ICD-10-CM | POA: Diagnosis not present

## 2023-04-23 DIAGNOSIS — I5032 Chronic diastolic (congestive) heart failure: Secondary | ICD-10-CM | POA: Diagnosis not present

## 2023-04-23 DIAGNOSIS — J9622 Acute and chronic respiratory failure with hypercapnia: Secondary | ICD-10-CM | POA: Diagnosis not present

## 2023-04-23 DIAGNOSIS — N1832 Chronic kidney disease, stage 3b: Secondary | ICD-10-CM | POA: Diagnosis not present

## 2023-04-23 DIAGNOSIS — J9621 Acute and chronic respiratory failure with hypoxia: Secondary | ICD-10-CM | POA: Diagnosis not present

## 2023-04-24 DIAGNOSIS — I130 Hypertensive heart and chronic kidney disease with heart failure and stage 1 through stage 4 chronic kidney disease, or unspecified chronic kidney disease: Secondary | ICD-10-CM | POA: Diagnosis not present

## 2023-04-24 DIAGNOSIS — N1832 Chronic kidney disease, stage 3b: Secondary | ICD-10-CM | POA: Diagnosis not present

## 2023-04-24 DIAGNOSIS — J9622 Acute and chronic respiratory failure with hypercapnia: Secondary | ICD-10-CM | POA: Diagnosis not present

## 2023-04-24 DIAGNOSIS — Z6841 Body Mass Index (BMI) 40.0 and over, adult: Secondary | ICD-10-CM | POA: Diagnosis not present

## 2023-04-24 DIAGNOSIS — J9621 Acute and chronic respiratory failure with hypoxia: Secondary | ICD-10-CM | POA: Diagnosis not present

## 2023-04-24 DIAGNOSIS — G629 Polyneuropathy, unspecified: Secondary | ICD-10-CM | POA: Diagnosis not present

## 2023-04-24 DIAGNOSIS — I5032 Chronic diastolic (congestive) heart failure: Secondary | ICD-10-CM | POA: Diagnosis not present

## 2023-04-24 DIAGNOSIS — N179 Acute kidney failure, unspecified: Secondary | ICD-10-CM | POA: Diagnosis not present

## 2023-04-25 DIAGNOSIS — J9622 Acute and chronic respiratory failure with hypercapnia: Secondary | ICD-10-CM | POA: Diagnosis not present

## 2023-04-25 DIAGNOSIS — G629 Polyneuropathy, unspecified: Secondary | ICD-10-CM | POA: Diagnosis not present

## 2023-04-25 DIAGNOSIS — N1832 Chronic kidney disease, stage 3b: Secondary | ICD-10-CM | POA: Diagnosis not present

## 2023-04-25 DIAGNOSIS — I5032 Chronic diastolic (congestive) heart failure: Secondary | ICD-10-CM | POA: Diagnosis not present

## 2023-04-25 DIAGNOSIS — J9621 Acute and chronic respiratory failure with hypoxia: Secondary | ICD-10-CM | POA: Diagnosis not present

## 2023-04-25 DIAGNOSIS — I130 Hypertensive heart and chronic kidney disease with heart failure and stage 1 through stage 4 chronic kidney disease, or unspecified chronic kidney disease: Secondary | ICD-10-CM | POA: Diagnosis not present

## 2023-04-25 DIAGNOSIS — N179 Acute kidney failure, unspecified: Secondary | ICD-10-CM | POA: Diagnosis not present

## 2023-04-26 DIAGNOSIS — J9622 Acute and chronic respiratory failure with hypercapnia: Secondary | ICD-10-CM | POA: Diagnosis not present

## 2023-04-26 DIAGNOSIS — N179 Acute kidney failure, unspecified: Secondary | ICD-10-CM | POA: Diagnosis not present

## 2023-04-26 DIAGNOSIS — G629 Polyneuropathy, unspecified: Secondary | ICD-10-CM | POA: Diagnosis not present

## 2023-04-26 DIAGNOSIS — I130 Hypertensive heart and chronic kidney disease with heart failure and stage 1 through stage 4 chronic kidney disease, or unspecified chronic kidney disease: Secondary | ICD-10-CM | POA: Diagnosis not present

## 2023-04-26 DIAGNOSIS — I5032 Chronic diastolic (congestive) heart failure: Secondary | ICD-10-CM | POA: Diagnosis not present

## 2023-04-26 DIAGNOSIS — N1832 Chronic kidney disease, stage 3b: Secondary | ICD-10-CM | POA: Diagnosis not present

## 2023-04-26 DIAGNOSIS — J9621 Acute and chronic respiratory failure with hypoxia: Secondary | ICD-10-CM | POA: Diagnosis not present

## 2023-04-26 DIAGNOSIS — Z6841 Body Mass Index (BMI) 40.0 and over, adult: Secondary | ICD-10-CM | POA: Diagnosis not present

## 2023-04-29 DIAGNOSIS — I5032 Chronic diastolic (congestive) heart failure: Secondary | ICD-10-CM | POA: Diagnosis not present

## 2023-04-29 DIAGNOSIS — G629 Polyneuropathy, unspecified: Secondary | ICD-10-CM | POA: Diagnosis not present

## 2023-04-29 DIAGNOSIS — J9621 Acute and chronic respiratory failure with hypoxia: Secondary | ICD-10-CM | POA: Diagnosis not present

## 2023-04-29 DIAGNOSIS — Z6841 Body Mass Index (BMI) 40.0 and over, adult: Secondary | ICD-10-CM | POA: Diagnosis not present

## 2023-04-29 DIAGNOSIS — J9622 Acute and chronic respiratory failure with hypercapnia: Secondary | ICD-10-CM | POA: Diagnosis not present

## 2023-04-29 DIAGNOSIS — N179 Acute kidney failure, unspecified: Secondary | ICD-10-CM | POA: Diagnosis not present

## 2023-04-29 DIAGNOSIS — I130 Hypertensive heart and chronic kidney disease with heart failure and stage 1 through stage 4 chronic kidney disease, or unspecified chronic kidney disease: Secondary | ICD-10-CM | POA: Diagnosis not present

## 2023-04-29 DIAGNOSIS — N1832 Chronic kidney disease, stage 3b: Secondary | ICD-10-CM | POA: Diagnosis not present

## 2023-04-30 DIAGNOSIS — I130 Hypertensive heart and chronic kidney disease with heart failure and stage 1 through stage 4 chronic kidney disease, or unspecified chronic kidney disease: Secondary | ICD-10-CM | POA: Diagnosis not present

## 2023-04-30 DIAGNOSIS — J9621 Acute and chronic respiratory failure with hypoxia: Secondary | ICD-10-CM | POA: Diagnosis not present

## 2023-04-30 DIAGNOSIS — J9622 Acute and chronic respiratory failure with hypercapnia: Secondary | ICD-10-CM | POA: Diagnosis not present

## 2023-04-30 DIAGNOSIS — N1832 Chronic kidney disease, stage 3b: Secondary | ICD-10-CM | POA: Diagnosis not present

## 2023-04-30 DIAGNOSIS — Z6841 Body Mass Index (BMI) 40.0 and over, adult: Secondary | ICD-10-CM | POA: Diagnosis not present

## 2023-04-30 DIAGNOSIS — I5032 Chronic diastolic (congestive) heart failure: Secondary | ICD-10-CM | POA: Diagnosis not present

## 2023-04-30 DIAGNOSIS — G629 Polyneuropathy, unspecified: Secondary | ICD-10-CM | POA: Diagnosis not present

## 2023-04-30 DIAGNOSIS — N179 Acute kidney failure, unspecified: Secondary | ICD-10-CM | POA: Diagnosis not present

## 2023-05-02 DIAGNOSIS — J9622 Acute and chronic respiratory failure with hypercapnia: Secondary | ICD-10-CM | POA: Diagnosis not present

## 2023-05-02 DIAGNOSIS — J9621 Acute and chronic respiratory failure with hypoxia: Secondary | ICD-10-CM | POA: Diagnosis not present

## 2023-05-02 DIAGNOSIS — N179 Acute kidney failure, unspecified: Secondary | ICD-10-CM | POA: Diagnosis not present

## 2023-05-02 DIAGNOSIS — N1832 Chronic kidney disease, stage 3b: Secondary | ICD-10-CM | POA: Diagnosis not present

## 2023-05-02 DIAGNOSIS — G629 Polyneuropathy, unspecified: Secondary | ICD-10-CM | POA: Diagnosis not present

## 2023-05-02 DIAGNOSIS — I130 Hypertensive heart and chronic kidney disease with heart failure and stage 1 through stage 4 chronic kidney disease, or unspecified chronic kidney disease: Secondary | ICD-10-CM | POA: Diagnosis not present

## 2023-05-02 DIAGNOSIS — Z6841 Body Mass Index (BMI) 40.0 and over, adult: Secondary | ICD-10-CM | POA: Diagnosis not present

## 2023-05-02 DIAGNOSIS — I5032 Chronic diastolic (congestive) heart failure: Secondary | ICD-10-CM | POA: Diagnosis not present

## 2023-05-06 DIAGNOSIS — G629 Polyneuropathy, unspecified: Secondary | ICD-10-CM | POA: Diagnosis not present

## 2023-05-06 DIAGNOSIS — J9621 Acute and chronic respiratory failure with hypoxia: Secondary | ICD-10-CM | POA: Diagnosis not present

## 2023-05-06 DIAGNOSIS — I130 Hypertensive heart and chronic kidney disease with heart failure and stage 1 through stage 4 chronic kidney disease, or unspecified chronic kidney disease: Secondary | ICD-10-CM | POA: Diagnosis not present

## 2023-05-06 DIAGNOSIS — N179 Acute kidney failure, unspecified: Secondary | ICD-10-CM | POA: Diagnosis not present

## 2023-05-06 DIAGNOSIS — N1832 Chronic kidney disease, stage 3b: Secondary | ICD-10-CM | POA: Diagnosis not present

## 2023-05-06 DIAGNOSIS — I5032 Chronic diastolic (congestive) heart failure: Secondary | ICD-10-CM | POA: Diagnosis not present

## 2023-05-06 DIAGNOSIS — J9622 Acute and chronic respiratory failure with hypercapnia: Secondary | ICD-10-CM | POA: Diagnosis not present

## 2023-05-06 DIAGNOSIS — Z6841 Body Mass Index (BMI) 40.0 and over, adult: Secondary | ICD-10-CM | POA: Diagnosis not present

## 2023-05-09 DIAGNOSIS — I5032 Chronic diastolic (congestive) heart failure: Secondary | ICD-10-CM | POA: Diagnosis not present

## 2023-05-09 DIAGNOSIS — N179 Acute kidney failure, unspecified: Secondary | ICD-10-CM | POA: Diagnosis not present

## 2023-05-09 DIAGNOSIS — I130 Hypertensive heart and chronic kidney disease with heart failure and stage 1 through stage 4 chronic kidney disease, or unspecified chronic kidney disease: Secondary | ICD-10-CM | POA: Diagnosis not present

## 2023-05-09 DIAGNOSIS — J9621 Acute and chronic respiratory failure with hypoxia: Secondary | ICD-10-CM | POA: Diagnosis not present

## 2023-05-09 DIAGNOSIS — Z6841 Body Mass Index (BMI) 40.0 and over, adult: Secondary | ICD-10-CM | POA: Diagnosis not present

## 2023-05-09 DIAGNOSIS — N1832 Chronic kidney disease, stage 3b: Secondary | ICD-10-CM | POA: Diagnosis not present

## 2023-05-09 DIAGNOSIS — J9622 Acute and chronic respiratory failure with hypercapnia: Secondary | ICD-10-CM | POA: Diagnosis not present

## 2023-05-09 DIAGNOSIS — G629 Polyneuropathy, unspecified: Secondary | ICD-10-CM | POA: Diagnosis not present

## 2023-05-14 DIAGNOSIS — I5032 Chronic diastolic (congestive) heart failure: Secondary | ICD-10-CM | POA: Diagnosis not present

## 2023-05-14 DIAGNOSIS — Z6841 Body Mass Index (BMI) 40.0 and over, adult: Secondary | ICD-10-CM | POA: Diagnosis not present

## 2023-05-14 DIAGNOSIS — G629 Polyneuropathy, unspecified: Secondary | ICD-10-CM | POA: Diagnosis not present

## 2023-05-14 DIAGNOSIS — I130 Hypertensive heart and chronic kidney disease with heart failure and stage 1 through stage 4 chronic kidney disease, or unspecified chronic kidney disease: Secondary | ICD-10-CM | POA: Diagnosis not present

## 2023-05-14 DIAGNOSIS — J9621 Acute and chronic respiratory failure with hypoxia: Secondary | ICD-10-CM | POA: Diagnosis not present

## 2023-05-14 DIAGNOSIS — N179 Acute kidney failure, unspecified: Secondary | ICD-10-CM | POA: Diagnosis not present

## 2023-05-14 DIAGNOSIS — J9622 Acute and chronic respiratory failure with hypercapnia: Secondary | ICD-10-CM | POA: Diagnosis not present

## 2023-05-14 DIAGNOSIS — N1832 Chronic kidney disease, stage 3b: Secondary | ICD-10-CM | POA: Diagnosis not present

## 2023-05-15 DIAGNOSIS — B351 Tinea unguium: Secondary | ICD-10-CM | POA: Diagnosis not present

## 2023-05-15 DIAGNOSIS — M79675 Pain in left toe(s): Secondary | ICD-10-CM | POA: Diagnosis not present

## 2023-05-15 DIAGNOSIS — M79674 Pain in right toe(s): Secondary | ICD-10-CM | POA: Diagnosis not present

## 2023-05-20 DIAGNOSIS — G4719 Other hypersomnia: Secondary | ICD-10-CM | POA: Diagnosis not present

## 2023-05-20 DIAGNOSIS — R0602 Shortness of breath: Secondary | ICD-10-CM | POA: Diagnosis not present

## 2023-05-21 DIAGNOSIS — I5032 Chronic diastolic (congestive) heart failure: Secondary | ICD-10-CM | POA: Diagnosis not present

## 2023-05-21 DIAGNOSIS — I130 Hypertensive heart and chronic kidney disease with heart failure and stage 1 through stage 4 chronic kidney disease, or unspecified chronic kidney disease: Secondary | ICD-10-CM | POA: Diagnosis not present

## 2023-05-21 DIAGNOSIS — Z6841 Body Mass Index (BMI) 40.0 and over, adult: Secondary | ICD-10-CM | POA: Diagnosis not present

## 2023-05-21 DIAGNOSIS — N179 Acute kidney failure, unspecified: Secondary | ICD-10-CM | POA: Diagnosis not present

## 2023-05-21 DIAGNOSIS — N1832 Chronic kidney disease, stage 3b: Secondary | ICD-10-CM | POA: Diagnosis not present

## 2023-05-21 DIAGNOSIS — J9621 Acute and chronic respiratory failure with hypoxia: Secondary | ICD-10-CM | POA: Diagnosis not present

## 2023-05-21 DIAGNOSIS — G629 Polyneuropathy, unspecified: Secondary | ICD-10-CM | POA: Diagnosis not present

## 2023-05-21 DIAGNOSIS — J9622 Acute and chronic respiratory failure with hypercapnia: Secondary | ICD-10-CM | POA: Diagnosis not present

## 2023-05-22 DIAGNOSIS — G629 Polyneuropathy, unspecified: Secondary | ICD-10-CM | POA: Diagnosis not present

## 2023-05-22 DIAGNOSIS — I130 Hypertensive heart and chronic kidney disease with heart failure and stage 1 through stage 4 chronic kidney disease, or unspecified chronic kidney disease: Secondary | ICD-10-CM | POA: Diagnosis not present

## 2023-05-22 DIAGNOSIS — Z6841 Body Mass Index (BMI) 40.0 and over, adult: Secondary | ICD-10-CM | POA: Diagnosis not present

## 2023-05-22 DIAGNOSIS — J9622 Acute and chronic respiratory failure with hypercapnia: Secondary | ICD-10-CM | POA: Diagnosis not present

## 2023-05-22 DIAGNOSIS — N1832 Chronic kidney disease, stage 3b: Secondary | ICD-10-CM | POA: Diagnosis not present

## 2023-05-22 DIAGNOSIS — J9621 Acute and chronic respiratory failure with hypoxia: Secondary | ICD-10-CM | POA: Diagnosis not present

## 2023-05-22 DIAGNOSIS — N179 Acute kidney failure, unspecified: Secondary | ICD-10-CM | POA: Diagnosis not present

## 2023-05-22 DIAGNOSIS — I5032 Chronic diastolic (congestive) heart failure: Secondary | ICD-10-CM | POA: Diagnosis not present

## 2023-07-11 DIAGNOSIS — M25461 Effusion, right knee: Secondary | ICD-10-CM | POA: Diagnosis not present

## 2023-07-11 DIAGNOSIS — M170 Bilateral primary osteoarthritis of knee: Secondary | ICD-10-CM | POA: Diagnosis not present

## 2023-07-11 DIAGNOSIS — M25462 Effusion, left knee: Secondary | ICD-10-CM | POA: Diagnosis not present

## 2023-07-11 DIAGNOSIS — G8929 Other chronic pain: Secondary | ICD-10-CM | POA: Diagnosis not present

## 2023-07-17 DIAGNOSIS — I129 Hypertensive chronic kidney disease with stage 1 through stage 4 chronic kidney disease, or unspecified chronic kidney disease: Secondary | ICD-10-CM | POA: Diagnosis not present

## 2023-07-17 DIAGNOSIS — D631 Anemia in chronic kidney disease: Secondary | ICD-10-CM | POA: Diagnosis not present

## 2023-07-17 DIAGNOSIS — N2581 Secondary hyperparathyroidism of renal origin: Secondary | ICD-10-CM | POA: Diagnosis not present

## 2023-07-17 DIAGNOSIS — N1832 Chronic kidney disease, stage 3b: Secondary | ICD-10-CM | POA: Diagnosis not present

## 2023-07-23 DIAGNOSIS — H5015 Alternating exotropia: Secondary | ICD-10-CM | POA: Diagnosis not present

## 2023-07-23 DIAGNOSIS — Z961 Presence of intraocular lens: Secondary | ICD-10-CM | POA: Diagnosis not present

## 2023-07-23 DIAGNOSIS — H35372 Puckering of macula, left eye: Secondary | ICD-10-CM | POA: Diagnosis not present

## 2023-07-23 DIAGNOSIS — H40013 Open angle with borderline findings, low risk, bilateral: Secondary | ICD-10-CM | POA: Diagnosis not present

## 2023-08-19 DIAGNOSIS — G629 Polyneuropathy, unspecified: Secondary | ICD-10-CM | POA: Diagnosis not present

## 2023-08-19 DIAGNOSIS — M549 Dorsalgia, unspecified: Secondary | ICD-10-CM | POA: Diagnosis not present

## 2023-08-21 DIAGNOSIS — M25561 Pain in right knee: Secondary | ICD-10-CM | POA: Diagnosis not present

## 2023-08-21 DIAGNOSIS — M545 Low back pain, unspecified: Secondary | ICD-10-CM | POA: Diagnosis not present

## 2023-08-21 DIAGNOSIS — N189 Chronic kidney disease, unspecified: Secondary | ICD-10-CM | POA: Diagnosis not present

## 2023-08-21 DIAGNOSIS — M25562 Pain in left knee: Secondary | ICD-10-CM | POA: Diagnosis not present

## 2023-08-21 DIAGNOSIS — I7300 Raynaud's syndrome without gangrene: Secondary | ICD-10-CM | POA: Diagnosis not present

## 2023-08-21 DIAGNOSIS — M109 Gout, unspecified: Secondary | ICD-10-CM | POA: Diagnosis not present

## 2023-08-21 DIAGNOSIS — B351 Tinea unguium: Secondary | ICD-10-CM | POA: Diagnosis not present

## 2023-08-21 DIAGNOSIS — K219 Gastro-esophageal reflux disease without esophagitis: Secondary | ICD-10-CM | POA: Diagnosis not present

## 2023-08-21 DIAGNOSIS — G8929 Other chronic pain: Secondary | ICD-10-CM | POA: Diagnosis not present

## 2023-08-21 DIAGNOSIS — L851 Acquired keratosis [keratoderma] palmaris et plantaris: Secondary | ICD-10-CM | POA: Diagnosis not present

## 2023-08-21 DIAGNOSIS — G629 Polyneuropathy, unspecified: Secondary | ICD-10-CM | POA: Diagnosis not present

## 2023-08-21 DIAGNOSIS — I129 Hypertensive chronic kidney disease with stage 1 through stage 4 chronic kidney disease, or unspecified chronic kidney disease: Secondary | ICD-10-CM | POA: Diagnosis not present

## 2023-08-27 DIAGNOSIS — M25562 Pain in left knee: Secondary | ICD-10-CM | POA: Diagnosis not present

## 2023-08-27 DIAGNOSIS — G8929 Other chronic pain: Secondary | ICD-10-CM | POA: Diagnosis not present

## 2023-08-27 DIAGNOSIS — M545 Low back pain, unspecified: Secondary | ICD-10-CM | POA: Diagnosis not present

## 2023-08-27 DIAGNOSIS — N189 Chronic kidney disease, unspecified: Secondary | ICD-10-CM | POA: Diagnosis not present

## 2023-08-27 DIAGNOSIS — M25561 Pain in right knee: Secondary | ICD-10-CM | POA: Diagnosis not present

## 2023-08-27 DIAGNOSIS — I129 Hypertensive chronic kidney disease with stage 1 through stage 4 chronic kidney disease, or unspecified chronic kidney disease: Secondary | ICD-10-CM | POA: Diagnosis not present

## 2023-08-27 DIAGNOSIS — K219 Gastro-esophageal reflux disease without esophagitis: Secondary | ICD-10-CM | POA: Diagnosis not present

## 2023-08-27 DIAGNOSIS — G629 Polyneuropathy, unspecified: Secondary | ICD-10-CM | POA: Diagnosis not present

## 2023-08-27 DIAGNOSIS — M109 Gout, unspecified: Secondary | ICD-10-CM | POA: Diagnosis not present

## 2023-09-02 DIAGNOSIS — M25561 Pain in right knee: Secondary | ICD-10-CM | POA: Diagnosis not present

## 2023-09-02 DIAGNOSIS — G8929 Other chronic pain: Secondary | ICD-10-CM | POA: Diagnosis not present

## 2023-09-02 DIAGNOSIS — G629 Polyneuropathy, unspecified: Secondary | ICD-10-CM | POA: Diagnosis not present

## 2023-09-02 DIAGNOSIS — K219 Gastro-esophageal reflux disease without esophagitis: Secondary | ICD-10-CM | POA: Diagnosis not present

## 2023-09-02 DIAGNOSIS — N189 Chronic kidney disease, unspecified: Secondary | ICD-10-CM | POA: Diagnosis not present

## 2023-09-02 DIAGNOSIS — M545 Low back pain, unspecified: Secondary | ICD-10-CM | POA: Diagnosis not present

## 2023-09-02 DIAGNOSIS — I129 Hypertensive chronic kidney disease with stage 1 through stage 4 chronic kidney disease, or unspecified chronic kidney disease: Secondary | ICD-10-CM | POA: Diagnosis not present

## 2023-09-02 DIAGNOSIS — M109 Gout, unspecified: Secondary | ICD-10-CM | POA: Diagnosis not present

## 2023-09-02 DIAGNOSIS — M25562 Pain in left knee: Secondary | ICD-10-CM | POA: Diagnosis not present

## 2023-09-04 DIAGNOSIS — R0989 Other specified symptoms and signs involving the circulatory and respiratory systems: Secondary | ICD-10-CM | POA: Diagnosis not present

## 2023-09-04 DIAGNOSIS — I10 Essential (primary) hypertension: Secondary | ICD-10-CM | POA: Diagnosis not present

## 2023-09-04 DIAGNOSIS — J9811 Atelectasis: Secondary | ICD-10-CM | POA: Diagnosis not present

## 2023-09-04 DIAGNOSIS — R0602 Shortness of breath: Secondary | ICD-10-CM | POA: Diagnosis not present

## 2023-09-04 DIAGNOSIS — I1310 Hypertensive heart and chronic kidney disease without heart failure, with stage 1 through stage 4 chronic kidney disease, or unspecified chronic kidney disease: Secondary | ICD-10-CM | POA: Diagnosis not present

## 2023-09-04 DIAGNOSIS — R7303 Prediabetes: Secondary | ICD-10-CM | POA: Diagnosis not present

## 2023-09-04 DIAGNOSIS — N1831 Chronic kidney disease, stage 3a: Secondary | ICD-10-CM | POA: Diagnosis not present

## 2023-09-04 DIAGNOSIS — R939 Diagnostic imaging inconclusive due to excess body fat of patient: Secondary | ICD-10-CM | POA: Diagnosis not present

## 2023-09-04 DIAGNOSIS — E782 Mixed hyperlipidemia: Secondary | ICD-10-CM | POA: Diagnosis not present

## 2023-09-05 DIAGNOSIS — I129 Hypertensive chronic kidney disease with stage 1 through stage 4 chronic kidney disease, or unspecified chronic kidney disease: Secondary | ICD-10-CM | POA: Diagnosis not present

## 2023-09-05 DIAGNOSIS — M25562 Pain in left knee: Secondary | ICD-10-CM | POA: Diagnosis not present

## 2023-09-05 DIAGNOSIS — M109 Gout, unspecified: Secondary | ICD-10-CM | POA: Diagnosis not present

## 2023-09-05 DIAGNOSIS — M25561 Pain in right knee: Secondary | ICD-10-CM | POA: Diagnosis not present

## 2023-09-05 DIAGNOSIS — M545 Low back pain, unspecified: Secondary | ICD-10-CM | POA: Diagnosis not present

## 2023-09-05 DIAGNOSIS — N189 Chronic kidney disease, unspecified: Secondary | ICD-10-CM | POA: Diagnosis not present

## 2023-09-05 DIAGNOSIS — K219 Gastro-esophageal reflux disease without esophagitis: Secondary | ICD-10-CM | POA: Diagnosis not present

## 2023-09-05 DIAGNOSIS — G8929 Other chronic pain: Secondary | ICD-10-CM | POA: Diagnosis not present

## 2023-09-05 DIAGNOSIS — G629 Polyneuropathy, unspecified: Secondary | ICD-10-CM | POA: Diagnosis not present

## 2023-09-09 DIAGNOSIS — M25562 Pain in left knee: Secondary | ICD-10-CM | POA: Diagnosis not present

## 2023-09-09 DIAGNOSIS — I129 Hypertensive chronic kidney disease with stage 1 through stage 4 chronic kidney disease, or unspecified chronic kidney disease: Secondary | ICD-10-CM | POA: Diagnosis not present

## 2023-09-09 DIAGNOSIS — G629 Polyneuropathy, unspecified: Secondary | ICD-10-CM | POA: Diagnosis not present

## 2023-09-09 DIAGNOSIS — M545 Low back pain, unspecified: Secondary | ICD-10-CM | POA: Diagnosis not present

## 2023-09-09 DIAGNOSIS — G8929 Other chronic pain: Secondary | ICD-10-CM | POA: Diagnosis not present

## 2023-09-09 DIAGNOSIS — M25561 Pain in right knee: Secondary | ICD-10-CM | POA: Diagnosis not present

## 2023-09-09 DIAGNOSIS — M109 Gout, unspecified: Secondary | ICD-10-CM | POA: Diagnosis not present

## 2023-09-09 DIAGNOSIS — K219 Gastro-esophageal reflux disease without esophagitis: Secondary | ICD-10-CM | POA: Diagnosis not present

## 2023-09-09 DIAGNOSIS — N189 Chronic kidney disease, unspecified: Secondary | ICD-10-CM | POA: Diagnosis not present

## 2023-09-10 DIAGNOSIS — R531 Weakness: Secondary | ICD-10-CM | POA: Diagnosis not present

## 2023-09-10 DIAGNOSIS — R0602 Shortness of breath: Secondary | ICD-10-CM | POA: Diagnosis not present

## 2023-09-10 DIAGNOSIS — N1831 Chronic kidney disease, stage 3a: Secondary | ICD-10-CM | POA: Diagnosis not present

## 2023-09-10 DIAGNOSIS — M109 Gout, unspecified: Secondary | ICD-10-CM | POA: Diagnosis not present

## 2023-09-10 DIAGNOSIS — R7303 Prediabetes: Secondary | ICD-10-CM | POA: Diagnosis not present

## 2023-09-10 DIAGNOSIS — Z23 Encounter for immunization: Secondary | ICD-10-CM | POA: Diagnosis not present

## 2023-09-10 DIAGNOSIS — I1310 Hypertensive heart and chronic kidney disease without heart failure, with stage 1 through stage 4 chronic kidney disease, or unspecified chronic kidney disease: Secondary | ICD-10-CM | POA: Diagnosis not present

## 2023-09-10 DIAGNOSIS — E782 Mixed hyperlipidemia: Secondary | ICD-10-CM | POA: Diagnosis not present

## 2023-09-10 DIAGNOSIS — N2581 Secondary hyperparathyroidism of renal origin: Secondary | ICD-10-CM | POA: Diagnosis not present

## 2023-09-12 DIAGNOSIS — M25561 Pain in right knee: Secondary | ICD-10-CM | POA: Diagnosis not present

## 2023-09-12 DIAGNOSIS — N189 Chronic kidney disease, unspecified: Secondary | ICD-10-CM | POA: Diagnosis not present

## 2023-09-12 DIAGNOSIS — I129 Hypertensive chronic kidney disease with stage 1 through stage 4 chronic kidney disease, or unspecified chronic kidney disease: Secondary | ICD-10-CM | POA: Diagnosis not present

## 2023-09-12 DIAGNOSIS — M109 Gout, unspecified: Secondary | ICD-10-CM | POA: Diagnosis not present

## 2023-09-12 DIAGNOSIS — K219 Gastro-esophageal reflux disease without esophagitis: Secondary | ICD-10-CM | POA: Diagnosis not present

## 2023-09-12 DIAGNOSIS — G8929 Other chronic pain: Secondary | ICD-10-CM | POA: Diagnosis not present

## 2023-09-12 DIAGNOSIS — G629 Polyneuropathy, unspecified: Secondary | ICD-10-CM | POA: Diagnosis not present

## 2023-09-12 DIAGNOSIS — M545 Low back pain, unspecified: Secondary | ICD-10-CM | POA: Diagnosis not present

## 2023-09-12 DIAGNOSIS — M25562 Pain in left knee: Secondary | ICD-10-CM | POA: Diagnosis not present

## 2023-09-16 DIAGNOSIS — I129 Hypertensive chronic kidney disease with stage 1 through stage 4 chronic kidney disease, or unspecified chronic kidney disease: Secondary | ICD-10-CM | POA: Diagnosis not present

## 2023-09-16 DIAGNOSIS — M545 Low back pain, unspecified: Secondary | ICD-10-CM | POA: Diagnosis not present

## 2023-09-16 DIAGNOSIS — M25561 Pain in right knee: Secondary | ICD-10-CM | POA: Diagnosis not present

## 2023-09-16 DIAGNOSIS — M25562 Pain in left knee: Secondary | ICD-10-CM | POA: Diagnosis not present

## 2023-09-16 DIAGNOSIS — M109 Gout, unspecified: Secondary | ICD-10-CM | POA: Diagnosis not present

## 2023-09-16 DIAGNOSIS — N189 Chronic kidney disease, unspecified: Secondary | ICD-10-CM | POA: Diagnosis not present

## 2023-09-16 DIAGNOSIS — K219 Gastro-esophageal reflux disease without esophagitis: Secondary | ICD-10-CM | POA: Diagnosis not present

## 2023-09-16 DIAGNOSIS — G8929 Other chronic pain: Secondary | ICD-10-CM | POA: Diagnosis not present

## 2023-09-16 DIAGNOSIS — G629 Polyneuropathy, unspecified: Secondary | ICD-10-CM | POA: Diagnosis not present

## 2023-09-19 DIAGNOSIS — I129 Hypertensive chronic kidney disease with stage 1 through stage 4 chronic kidney disease, or unspecified chronic kidney disease: Secondary | ICD-10-CM | POA: Diagnosis not present

## 2023-09-19 DIAGNOSIS — G629 Polyneuropathy, unspecified: Secondary | ICD-10-CM | POA: Diagnosis not present

## 2023-09-19 DIAGNOSIS — M25562 Pain in left knee: Secondary | ICD-10-CM | POA: Diagnosis not present

## 2023-09-19 DIAGNOSIS — K219 Gastro-esophageal reflux disease without esophagitis: Secondary | ICD-10-CM | POA: Diagnosis not present

## 2023-09-19 DIAGNOSIS — M109 Gout, unspecified: Secondary | ICD-10-CM | POA: Diagnosis not present

## 2023-09-19 DIAGNOSIS — M25561 Pain in right knee: Secondary | ICD-10-CM | POA: Diagnosis not present

## 2023-09-19 DIAGNOSIS — N189 Chronic kidney disease, unspecified: Secondary | ICD-10-CM | POA: Diagnosis not present

## 2023-09-19 DIAGNOSIS — M545 Low back pain, unspecified: Secondary | ICD-10-CM | POA: Diagnosis not present

## 2023-09-19 DIAGNOSIS — G8929 Other chronic pain: Secondary | ICD-10-CM | POA: Diagnosis not present

## 2023-09-23 DIAGNOSIS — N189 Chronic kidney disease, unspecified: Secondary | ICD-10-CM | POA: Diagnosis not present

## 2023-09-23 DIAGNOSIS — M25562 Pain in left knee: Secondary | ICD-10-CM | POA: Diagnosis not present

## 2023-09-23 DIAGNOSIS — K219 Gastro-esophageal reflux disease without esophagitis: Secondary | ICD-10-CM | POA: Diagnosis not present

## 2023-09-23 DIAGNOSIS — G8929 Other chronic pain: Secondary | ICD-10-CM | POA: Diagnosis not present

## 2023-09-23 DIAGNOSIS — M25561 Pain in right knee: Secondary | ICD-10-CM | POA: Diagnosis not present

## 2023-09-23 DIAGNOSIS — M109 Gout, unspecified: Secondary | ICD-10-CM | POA: Diagnosis not present

## 2023-09-23 DIAGNOSIS — G629 Polyneuropathy, unspecified: Secondary | ICD-10-CM | POA: Diagnosis not present

## 2023-09-23 DIAGNOSIS — M545 Low back pain, unspecified: Secondary | ICD-10-CM | POA: Diagnosis not present

## 2023-09-23 DIAGNOSIS — I129 Hypertensive chronic kidney disease with stage 1 through stage 4 chronic kidney disease, or unspecified chronic kidney disease: Secondary | ICD-10-CM | POA: Diagnosis not present

## 2023-09-30 DIAGNOSIS — M25562 Pain in left knee: Secondary | ICD-10-CM | POA: Diagnosis not present

## 2023-09-30 DIAGNOSIS — M109 Gout, unspecified: Secondary | ICD-10-CM | POA: Diagnosis not present

## 2023-09-30 DIAGNOSIS — M25561 Pain in right knee: Secondary | ICD-10-CM | POA: Diagnosis not present

## 2023-09-30 DIAGNOSIS — K219 Gastro-esophageal reflux disease without esophagitis: Secondary | ICD-10-CM | POA: Diagnosis not present

## 2023-09-30 DIAGNOSIS — G629 Polyneuropathy, unspecified: Secondary | ICD-10-CM | POA: Diagnosis not present

## 2023-09-30 DIAGNOSIS — N189 Chronic kidney disease, unspecified: Secondary | ICD-10-CM | POA: Diagnosis not present

## 2023-09-30 DIAGNOSIS — G8929 Other chronic pain: Secondary | ICD-10-CM | POA: Diagnosis not present

## 2023-09-30 DIAGNOSIS — M545 Low back pain, unspecified: Secondary | ICD-10-CM | POA: Diagnosis not present

## 2023-09-30 DIAGNOSIS — I129 Hypertensive chronic kidney disease with stage 1 through stage 4 chronic kidney disease, or unspecified chronic kidney disease: Secondary | ICD-10-CM | POA: Diagnosis not present

## 2023-10-03 DIAGNOSIS — R0689 Other abnormalities of breathing: Secondary | ICD-10-CM | POA: Diagnosis not present

## 2023-10-03 DIAGNOSIS — Z6841 Body Mass Index (BMI) 40.0 and over, adult: Secondary | ICD-10-CM | POA: Diagnosis not present

## 2023-10-03 DIAGNOSIS — G4733 Obstructive sleep apnea (adult) (pediatric): Secondary | ICD-10-CM | POA: Diagnosis not present

## 2023-10-03 DIAGNOSIS — J9811 Atelectasis: Secondary | ICD-10-CM | POA: Diagnosis not present

## 2023-10-04 DIAGNOSIS — M109 Gout, unspecified: Secondary | ICD-10-CM | POA: Diagnosis not present

## 2023-10-04 DIAGNOSIS — K219 Gastro-esophageal reflux disease without esophagitis: Secondary | ICD-10-CM | POA: Diagnosis not present

## 2023-10-04 DIAGNOSIS — G8929 Other chronic pain: Secondary | ICD-10-CM | POA: Diagnosis not present

## 2023-10-04 DIAGNOSIS — N189 Chronic kidney disease, unspecified: Secondary | ICD-10-CM | POA: Diagnosis not present

## 2023-10-04 DIAGNOSIS — M545 Low back pain, unspecified: Secondary | ICD-10-CM | POA: Diagnosis not present

## 2023-10-04 DIAGNOSIS — I129 Hypertensive chronic kidney disease with stage 1 through stage 4 chronic kidney disease, or unspecified chronic kidney disease: Secondary | ICD-10-CM | POA: Diagnosis not present

## 2023-10-04 DIAGNOSIS — G629 Polyneuropathy, unspecified: Secondary | ICD-10-CM | POA: Diagnosis not present

## 2023-10-04 DIAGNOSIS — M25561 Pain in right knee: Secondary | ICD-10-CM | POA: Diagnosis not present

## 2023-10-04 DIAGNOSIS — M25562 Pain in left knee: Secondary | ICD-10-CM | POA: Diagnosis not present

## 2023-10-07 DIAGNOSIS — N189 Chronic kidney disease, unspecified: Secondary | ICD-10-CM | POA: Diagnosis not present

## 2023-10-07 DIAGNOSIS — M25561 Pain in right knee: Secondary | ICD-10-CM | POA: Diagnosis not present

## 2023-10-07 DIAGNOSIS — M109 Gout, unspecified: Secondary | ICD-10-CM | POA: Diagnosis not present

## 2023-10-07 DIAGNOSIS — G8929 Other chronic pain: Secondary | ICD-10-CM | POA: Diagnosis not present

## 2023-10-07 DIAGNOSIS — G629 Polyneuropathy, unspecified: Secondary | ICD-10-CM | POA: Diagnosis not present

## 2023-10-07 DIAGNOSIS — K219 Gastro-esophageal reflux disease without esophagitis: Secondary | ICD-10-CM | POA: Diagnosis not present

## 2023-10-07 DIAGNOSIS — M25562 Pain in left knee: Secondary | ICD-10-CM | POA: Diagnosis not present

## 2023-10-07 DIAGNOSIS — I129 Hypertensive chronic kidney disease with stage 1 through stage 4 chronic kidney disease, or unspecified chronic kidney disease: Secondary | ICD-10-CM | POA: Diagnosis not present

## 2023-10-07 DIAGNOSIS — M545 Low back pain, unspecified: Secondary | ICD-10-CM | POA: Diagnosis not present

## 2023-10-10 DIAGNOSIS — I10 Essential (primary) hypertension: Secondary | ICD-10-CM | POA: Diagnosis not present

## 2023-10-10 DIAGNOSIS — I959 Hypotension, unspecified: Secondary | ICD-10-CM | POA: Diagnosis not present

## 2023-10-10 DIAGNOSIS — I272 Pulmonary hypertension, unspecified: Secondary | ICD-10-CM | POA: Diagnosis not present

## 2023-10-10 DIAGNOSIS — N1831 Chronic kidney disease, stage 3a: Secondary | ICD-10-CM | POA: Diagnosis not present

## 2023-10-10 DIAGNOSIS — Z6841 Body Mass Index (BMI) 40.0 and over, adult: Secondary | ICD-10-CM | POA: Diagnosis not present

## 2023-10-10 DIAGNOSIS — K219 Gastro-esophageal reflux disease without esophagitis: Secondary | ICD-10-CM | POA: Diagnosis not present

## 2023-10-10 DIAGNOSIS — G4733 Obstructive sleep apnea (adult) (pediatric): Secondary | ICD-10-CM | POA: Diagnosis not present

## 2023-10-15 DIAGNOSIS — G629 Polyneuropathy, unspecified: Secondary | ICD-10-CM | POA: Diagnosis not present

## 2023-10-15 DIAGNOSIS — I129 Hypertensive chronic kidney disease with stage 1 through stage 4 chronic kidney disease, or unspecified chronic kidney disease: Secondary | ICD-10-CM | POA: Diagnosis not present

## 2023-10-15 DIAGNOSIS — M25562 Pain in left knee: Secondary | ICD-10-CM | POA: Diagnosis not present

## 2023-10-15 DIAGNOSIS — N189 Chronic kidney disease, unspecified: Secondary | ICD-10-CM | POA: Diagnosis not present

## 2023-10-15 DIAGNOSIS — M545 Low back pain, unspecified: Secondary | ICD-10-CM | POA: Diagnosis not present

## 2023-10-15 DIAGNOSIS — M25561 Pain in right knee: Secondary | ICD-10-CM | POA: Diagnosis not present

## 2023-10-15 DIAGNOSIS — K219 Gastro-esophageal reflux disease without esophagitis: Secondary | ICD-10-CM | POA: Diagnosis not present

## 2023-10-15 DIAGNOSIS — G8929 Other chronic pain: Secondary | ICD-10-CM | POA: Diagnosis not present

## 2023-10-15 DIAGNOSIS — M109 Gout, unspecified: Secondary | ICD-10-CM | POA: Diagnosis not present

## 2023-10-17 DIAGNOSIS — I129 Hypertensive chronic kidney disease with stage 1 through stage 4 chronic kidney disease, or unspecified chronic kidney disease: Secondary | ICD-10-CM | POA: Diagnosis not present

## 2023-10-17 DIAGNOSIS — N2581 Secondary hyperparathyroidism of renal origin: Secondary | ICD-10-CM | POA: Diagnosis not present

## 2023-10-17 DIAGNOSIS — N1832 Chronic kidney disease, stage 3b: Secondary | ICD-10-CM | POA: Diagnosis not present

## 2023-10-17 DIAGNOSIS — D631 Anemia in chronic kidney disease: Secondary | ICD-10-CM | POA: Diagnosis not present

## 2023-10-22 DIAGNOSIS — M170 Bilateral primary osteoarthritis of knee: Secondary | ICD-10-CM | POA: Diagnosis not present

## 2023-10-22 DIAGNOSIS — G629 Polyneuropathy, unspecified: Secondary | ICD-10-CM | POA: Diagnosis not present

## 2023-10-22 DIAGNOSIS — M25462 Effusion, left knee: Secondary | ICD-10-CM | POA: Diagnosis not present

## 2023-10-22 DIAGNOSIS — I130 Hypertensive heart and chronic kidney disease with heart failure and stage 1 through stage 4 chronic kidney disease, or unspecified chronic kidney disease: Secondary | ICD-10-CM | POA: Diagnosis not present

## 2023-10-22 DIAGNOSIS — M25461 Effusion, right knee: Secondary | ICD-10-CM | POA: Diagnosis not present

## 2023-10-22 DIAGNOSIS — M25561 Pain in right knee: Secondary | ICD-10-CM | POA: Diagnosis not present

## 2023-10-22 DIAGNOSIS — I509 Heart failure, unspecified: Secondary | ICD-10-CM | POA: Diagnosis not present

## 2023-10-22 DIAGNOSIS — G8929 Other chronic pain: Secondary | ICD-10-CM | POA: Diagnosis not present

## 2023-10-22 DIAGNOSIS — M25562 Pain in left knee: Secondary | ICD-10-CM | POA: Diagnosis not present

## 2023-10-22 DIAGNOSIS — M545 Low back pain, unspecified: Secondary | ICD-10-CM | POA: Diagnosis not present

## 2023-12-23 DIAGNOSIS — M79675 Pain in left toe(s): Secondary | ICD-10-CM | POA: Diagnosis not present

## 2023-12-23 DIAGNOSIS — L851 Acquired keratosis [keratoderma] palmaris et plantaris: Secondary | ICD-10-CM | POA: Diagnosis not present

## 2023-12-23 DIAGNOSIS — B351 Tinea unguium: Secondary | ICD-10-CM | POA: Diagnosis not present

## 2023-12-23 DIAGNOSIS — R0602 Shortness of breath: Secondary | ICD-10-CM | POA: Diagnosis not present

## 2023-12-23 DIAGNOSIS — M79674 Pain in right toe(s): Secondary | ICD-10-CM | POA: Diagnosis not present

## 2023-12-23 DIAGNOSIS — G4733 Obstructive sleep apnea (adult) (pediatric): Secondary | ICD-10-CM | POA: Diagnosis not present

## 2024-01-08 DIAGNOSIS — G8929 Other chronic pain: Secondary | ICD-10-CM | POA: Diagnosis not present

## 2024-01-08 DIAGNOSIS — R2689 Other abnormalities of gait and mobility: Secondary | ICD-10-CM | POA: Diagnosis not present

## 2024-01-08 DIAGNOSIS — M545 Low back pain, unspecified: Secondary | ICD-10-CM | POA: Diagnosis not present

## 2024-01-08 DIAGNOSIS — F39 Unspecified mood [affective] disorder: Secondary | ICD-10-CM | POA: Diagnosis not present

## 2024-01-08 DIAGNOSIS — G629 Polyneuropathy, unspecified: Secondary | ICD-10-CM | POA: Diagnosis not present

## 2024-01-08 DIAGNOSIS — H9193 Unspecified hearing loss, bilateral: Secondary | ICD-10-CM | POA: Diagnosis not present

## 2024-01-13 ENCOUNTER — Ambulatory Visit: Attending: Otolaryngology

## 2024-01-13 DIAGNOSIS — G4733 Obstructive sleep apnea (adult) (pediatric): Secondary | ICD-10-CM | POA: Diagnosis not present

## 2024-01-13 DIAGNOSIS — R682 Dry mouth, unspecified: Secondary | ICD-10-CM | POA: Diagnosis present

## 2024-01-13 DIAGNOSIS — R252 Cramp and spasm: Secondary | ICD-10-CM | POA: Diagnosis present

## 2024-01-13 DIAGNOSIS — R0689 Other abnormalities of breathing: Secondary | ICD-10-CM | POA: Diagnosis present

## 2024-01-24 DIAGNOSIS — M7138 Other bursal cyst, other site: Secondary | ICD-10-CM | POA: Diagnosis not present

## 2024-01-24 DIAGNOSIS — F32A Depression, unspecified: Secondary | ICD-10-CM | POA: Diagnosis not present

## 2024-01-24 DIAGNOSIS — N189 Chronic kidney disease, unspecified: Secondary | ICD-10-CM | POA: Diagnosis not present

## 2024-01-24 DIAGNOSIS — I129 Hypertensive chronic kidney disease with stage 1 through stage 4 chronic kidney disease, or unspecified chronic kidney disease: Secondary | ICD-10-CM | POA: Diagnosis not present

## 2024-01-24 DIAGNOSIS — G629 Polyneuropathy, unspecified: Secondary | ICD-10-CM | POA: Diagnosis not present

## 2024-01-24 DIAGNOSIS — M47816 Spondylosis without myelopathy or radiculopathy, lumbar region: Secondary | ICD-10-CM | POA: Diagnosis not present

## 2024-01-24 DIAGNOSIS — M48061 Spinal stenosis, lumbar region without neurogenic claudication: Secondary | ICD-10-CM | POA: Diagnosis not present

## 2024-01-24 DIAGNOSIS — M1030 Gout due to renal impairment, unspecified site: Secondary | ICD-10-CM | POA: Diagnosis not present

## 2024-01-24 DIAGNOSIS — M1990 Unspecified osteoarthritis, unspecified site: Secondary | ICD-10-CM | POA: Diagnosis not present

## 2024-01-27 DIAGNOSIS — M1030 Gout due to renal impairment, unspecified site: Secondary | ICD-10-CM | POA: Diagnosis not present

## 2024-01-27 DIAGNOSIS — I129 Hypertensive chronic kidney disease with stage 1 through stage 4 chronic kidney disease, or unspecified chronic kidney disease: Secondary | ICD-10-CM | POA: Diagnosis not present

## 2024-01-27 DIAGNOSIS — G629 Polyneuropathy, unspecified: Secondary | ICD-10-CM | POA: Diagnosis not present

## 2024-01-27 DIAGNOSIS — F32A Depression, unspecified: Secondary | ICD-10-CM | POA: Diagnosis not present

## 2024-01-27 DIAGNOSIS — M7138 Other bursal cyst, other site: Secondary | ICD-10-CM | POA: Diagnosis not present

## 2024-01-27 DIAGNOSIS — N189 Chronic kidney disease, unspecified: Secondary | ICD-10-CM | POA: Diagnosis not present

## 2024-01-27 DIAGNOSIS — M48061 Spinal stenosis, lumbar region without neurogenic claudication: Secondary | ICD-10-CM | POA: Diagnosis not present

## 2024-01-27 DIAGNOSIS — H903 Sensorineural hearing loss, bilateral: Secondary | ICD-10-CM | POA: Diagnosis not present

## 2024-01-27 DIAGNOSIS — M1990 Unspecified osteoarthritis, unspecified site: Secondary | ICD-10-CM | POA: Diagnosis not present

## 2024-01-27 DIAGNOSIS — M47816 Spondylosis without myelopathy or radiculopathy, lumbar region: Secondary | ICD-10-CM | POA: Diagnosis not present

## 2024-01-28 DIAGNOSIS — G629 Polyneuropathy, unspecified: Secondary | ICD-10-CM | POA: Diagnosis not present

## 2024-01-28 DIAGNOSIS — M1990 Unspecified osteoarthritis, unspecified site: Secondary | ICD-10-CM | POA: Diagnosis not present

## 2024-01-28 DIAGNOSIS — M47816 Spondylosis without myelopathy or radiculopathy, lumbar region: Secondary | ICD-10-CM | POA: Diagnosis not present

## 2024-01-28 DIAGNOSIS — F32A Depression, unspecified: Secondary | ICD-10-CM | POA: Diagnosis not present

## 2024-01-28 DIAGNOSIS — I129 Hypertensive chronic kidney disease with stage 1 through stage 4 chronic kidney disease, or unspecified chronic kidney disease: Secondary | ICD-10-CM | POA: Diagnosis not present

## 2024-01-28 DIAGNOSIS — M1030 Gout due to renal impairment, unspecified site: Secondary | ICD-10-CM | POA: Diagnosis not present

## 2024-01-28 DIAGNOSIS — H903 Sensorineural hearing loss, bilateral: Secondary | ICD-10-CM | POA: Diagnosis not present

## 2024-01-28 DIAGNOSIS — N189 Chronic kidney disease, unspecified: Secondary | ICD-10-CM | POA: Diagnosis not present

## 2024-01-29 DIAGNOSIS — G629 Polyneuropathy, unspecified: Secondary | ICD-10-CM | POA: Diagnosis not present

## 2024-01-29 DIAGNOSIS — M1990 Unspecified osteoarthritis, unspecified site: Secondary | ICD-10-CM | POA: Diagnosis not present

## 2024-01-29 DIAGNOSIS — M47816 Spondylosis without myelopathy or radiculopathy, lumbar region: Secondary | ICD-10-CM | POA: Diagnosis not present

## 2024-01-29 DIAGNOSIS — N189 Chronic kidney disease, unspecified: Secondary | ICD-10-CM | POA: Diagnosis not present

## 2024-01-29 DIAGNOSIS — I129 Hypertensive chronic kidney disease with stage 1 through stage 4 chronic kidney disease, or unspecified chronic kidney disease: Secondary | ICD-10-CM | POA: Diagnosis not present

## 2024-01-29 DIAGNOSIS — F32A Depression, unspecified: Secondary | ICD-10-CM | POA: Diagnosis not present

## 2024-01-29 DIAGNOSIS — M7138 Other bursal cyst, other site: Secondary | ICD-10-CM | POA: Diagnosis not present

## 2024-01-29 DIAGNOSIS — M48061 Spinal stenosis, lumbar region without neurogenic claudication: Secondary | ICD-10-CM | POA: Diagnosis not present

## 2024-01-29 DIAGNOSIS — M1030 Gout due to renal impairment, unspecified site: Secondary | ICD-10-CM | POA: Diagnosis not present

## 2024-01-30 DIAGNOSIS — M47816 Spondylosis without myelopathy or radiculopathy, lumbar region: Secondary | ICD-10-CM | POA: Diagnosis not present

## 2024-01-30 DIAGNOSIS — I129 Hypertensive chronic kidney disease with stage 1 through stage 4 chronic kidney disease, or unspecified chronic kidney disease: Secondary | ICD-10-CM | POA: Diagnosis not present

## 2024-01-30 DIAGNOSIS — N189 Chronic kidney disease, unspecified: Secondary | ICD-10-CM | POA: Diagnosis not present

## 2024-01-30 DIAGNOSIS — M7138 Other bursal cyst, other site: Secondary | ICD-10-CM | POA: Diagnosis not present

## 2024-01-30 DIAGNOSIS — F32A Depression, unspecified: Secondary | ICD-10-CM | POA: Diagnosis not present

## 2024-01-30 DIAGNOSIS — M1990 Unspecified osteoarthritis, unspecified site: Secondary | ICD-10-CM | POA: Diagnosis not present

## 2024-01-30 DIAGNOSIS — G629 Polyneuropathy, unspecified: Secondary | ICD-10-CM | POA: Diagnosis not present

## 2024-01-30 DIAGNOSIS — M1030 Gout due to renal impairment, unspecified site: Secondary | ICD-10-CM | POA: Diagnosis not present

## 2024-01-30 DIAGNOSIS — M48061 Spinal stenosis, lumbar region without neurogenic claudication: Secondary | ICD-10-CM | POA: Diagnosis not present

## 2024-02-03 DIAGNOSIS — G4733 Obstructive sleep apnea (adult) (pediatric): Secondary | ICD-10-CM | POA: Diagnosis not present

## 2024-02-03 DIAGNOSIS — Z9981 Dependence on supplemental oxygen: Secondary | ICD-10-CM | POA: Diagnosis not present

## 2024-02-03 DIAGNOSIS — Z6841 Body Mass Index (BMI) 40.0 and over, adult: Secondary | ICD-10-CM | POA: Diagnosis not present

## 2024-02-03 DIAGNOSIS — J984 Other disorders of lung: Secondary | ICD-10-CM | POA: Diagnosis not present

## 2024-02-03 DIAGNOSIS — J9622 Acute and chronic respiratory failure with hypercapnia: Secondary | ICD-10-CM | POA: Diagnosis not present

## 2024-02-03 DIAGNOSIS — J9811 Atelectasis: Secondary | ICD-10-CM | POA: Diagnosis not present

## 2024-02-07 DIAGNOSIS — M1030 Gout due to renal impairment, unspecified site: Secondary | ICD-10-CM | POA: Diagnosis not present

## 2024-02-07 DIAGNOSIS — F32A Depression, unspecified: Secondary | ICD-10-CM | POA: Diagnosis not present

## 2024-02-07 DIAGNOSIS — M1990 Unspecified osteoarthritis, unspecified site: Secondary | ICD-10-CM | POA: Diagnosis not present

## 2024-02-07 DIAGNOSIS — M7138 Other bursal cyst, other site: Secondary | ICD-10-CM | POA: Diagnosis not present

## 2024-02-07 DIAGNOSIS — M47816 Spondylosis without myelopathy or radiculopathy, lumbar region: Secondary | ICD-10-CM | POA: Diagnosis not present

## 2024-02-07 DIAGNOSIS — G629 Polyneuropathy, unspecified: Secondary | ICD-10-CM | POA: Diagnosis not present

## 2024-02-07 DIAGNOSIS — I129 Hypertensive chronic kidney disease with stage 1 through stage 4 chronic kidney disease, or unspecified chronic kidney disease: Secondary | ICD-10-CM | POA: Diagnosis not present

## 2024-02-07 DIAGNOSIS — N189 Chronic kidney disease, unspecified: Secondary | ICD-10-CM | POA: Diagnosis not present

## 2024-02-07 DIAGNOSIS — M48061 Spinal stenosis, lumbar region without neurogenic claudication: Secondary | ICD-10-CM | POA: Diagnosis not present

## 2024-02-13 DIAGNOSIS — M1990 Unspecified osteoarthritis, unspecified site: Secondary | ICD-10-CM | POA: Diagnosis not present

## 2024-02-13 DIAGNOSIS — N189 Chronic kidney disease, unspecified: Secondary | ICD-10-CM | POA: Diagnosis not present

## 2024-02-13 DIAGNOSIS — M7138 Other bursal cyst, other site: Secondary | ICD-10-CM | POA: Diagnosis not present

## 2024-02-13 DIAGNOSIS — F32A Depression, unspecified: Secondary | ICD-10-CM | POA: Diagnosis not present

## 2024-02-13 DIAGNOSIS — M47816 Spondylosis without myelopathy or radiculopathy, lumbar region: Secondary | ICD-10-CM | POA: Diagnosis not present

## 2024-02-13 DIAGNOSIS — G629 Polyneuropathy, unspecified: Secondary | ICD-10-CM | POA: Diagnosis not present

## 2024-02-13 DIAGNOSIS — I129 Hypertensive chronic kidney disease with stage 1 through stage 4 chronic kidney disease, or unspecified chronic kidney disease: Secondary | ICD-10-CM | POA: Diagnosis not present

## 2024-02-13 DIAGNOSIS — M48061 Spinal stenosis, lumbar region without neurogenic claudication: Secondary | ICD-10-CM | POA: Diagnosis not present

## 2024-02-13 DIAGNOSIS — M1030 Gout due to renal impairment, unspecified site: Secondary | ICD-10-CM | POA: Diagnosis not present

## 2024-02-19 DIAGNOSIS — I129 Hypertensive chronic kidney disease with stage 1 through stage 4 chronic kidney disease, or unspecified chronic kidney disease: Secondary | ICD-10-CM | POA: Diagnosis not present

## 2024-02-19 DIAGNOSIS — M48061 Spinal stenosis, lumbar region without neurogenic claudication: Secondary | ICD-10-CM | POA: Diagnosis not present

## 2024-02-19 DIAGNOSIS — M1990 Unspecified osteoarthritis, unspecified site: Secondary | ICD-10-CM | POA: Diagnosis not present

## 2024-02-19 DIAGNOSIS — M7138 Other bursal cyst, other site: Secondary | ICD-10-CM | POA: Diagnosis not present

## 2024-02-19 DIAGNOSIS — M1030 Gout due to renal impairment, unspecified site: Secondary | ICD-10-CM | POA: Diagnosis not present

## 2024-02-19 DIAGNOSIS — N189 Chronic kidney disease, unspecified: Secondary | ICD-10-CM | POA: Diagnosis not present

## 2024-02-19 DIAGNOSIS — M47816 Spondylosis without myelopathy or radiculopathy, lumbar region: Secondary | ICD-10-CM | POA: Diagnosis not present

## 2024-02-19 DIAGNOSIS — G629 Polyneuropathy, unspecified: Secondary | ICD-10-CM | POA: Diagnosis not present

## 2024-02-19 DIAGNOSIS — F32A Depression, unspecified: Secondary | ICD-10-CM | POA: Diagnosis not present

## 2024-02-25 DIAGNOSIS — M7138 Other bursal cyst, other site: Secondary | ICD-10-CM | POA: Diagnosis not present

## 2024-02-25 DIAGNOSIS — N189 Chronic kidney disease, unspecified: Secondary | ICD-10-CM | POA: Diagnosis not present

## 2024-02-25 DIAGNOSIS — M47816 Spondylosis without myelopathy or radiculopathy, lumbar region: Secondary | ICD-10-CM | POA: Diagnosis not present

## 2024-02-25 DIAGNOSIS — M1030 Gout due to renal impairment, unspecified site: Secondary | ICD-10-CM | POA: Diagnosis not present

## 2024-02-25 DIAGNOSIS — F32A Depression, unspecified: Secondary | ICD-10-CM | POA: Diagnosis not present

## 2024-02-25 DIAGNOSIS — M1990 Unspecified osteoarthritis, unspecified site: Secondary | ICD-10-CM | POA: Diagnosis not present

## 2024-02-25 DIAGNOSIS — M48061 Spinal stenosis, lumbar region without neurogenic claudication: Secondary | ICD-10-CM | POA: Diagnosis not present

## 2024-02-25 DIAGNOSIS — G629 Polyneuropathy, unspecified: Secondary | ICD-10-CM | POA: Diagnosis not present

## 2024-02-25 DIAGNOSIS — I129 Hypertensive chronic kidney disease with stage 1 through stage 4 chronic kidney disease, or unspecified chronic kidney disease: Secondary | ICD-10-CM | POA: Diagnosis not present

## 2024-02-26 DIAGNOSIS — M48061 Spinal stenosis, lumbar region without neurogenic claudication: Secondary | ICD-10-CM | POA: Diagnosis not present

## 2024-02-26 DIAGNOSIS — G629 Polyneuropathy, unspecified: Secondary | ICD-10-CM | POA: Diagnosis not present

## 2024-02-26 DIAGNOSIS — M1030 Gout due to renal impairment, unspecified site: Secondary | ICD-10-CM | POA: Diagnosis not present

## 2024-02-26 DIAGNOSIS — N189 Chronic kidney disease, unspecified: Secondary | ICD-10-CM | POA: Diagnosis not present

## 2024-02-26 DIAGNOSIS — I129 Hypertensive chronic kidney disease with stage 1 through stage 4 chronic kidney disease, or unspecified chronic kidney disease: Secondary | ICD-10-CM | POA: Diagnosis not present

## 2024-02-26 DIAGNOSIS — M1990 Unspecified osteoarthritis, unspecified site: Secondary | ICD-10-CM | POA: Diagnosis not present

## 2024-02-26 DIAGNOSIS — F32A Depression, unspecified: Secondary | ICD-10-CM | POA: Diagnosis not present

## 2024-02-26 DIAGNOSIS — M47816 Spondylosis without myelopathy or radiculopathy, lumbar region: Secondary | ICD-10-CM | POA: Diagnosis not present

## 2024-02-26 DIAGNOSIS — M7138 Other bursal cyst, other site: Secondary | ICD-10-CM | POA: Diagnosis not present

## 2024-03-03 DIAGNOSIS — E782 Mixed hyperlipidemia: Secondary | ICD-10-CM | POA: Diagnosis not present

## 2024-03-03 DIAGNOSIS — I10 Essential (primary) hypertension: Secondary | ICD-10-CM | POA: Diagnosis not present

## 2024-03-03 DIAGNOSIS — N1831 Chronic kidney disease, stage 3a: Secondary | ICD-10-CM | POA: Diagnosis not present

## 2024-03-03 DIAGNOSIS — R7303 Prediabetes: Secondary | ICD-10-CM | POA: Diagnosis not present

## 2024-03-04 DIAGNOSIS — N189 Chronic kidney disease, unspecified: Secondary | ICD-10-CM | POA: Diagnosis not present

## 2024-03-04 DIAGNOSIS — M1990 Unspecified osteoarthritis, unspecified site: Secondary | ICD-10-CM | POA: Diagnosis not present

## 2024-03-04 DIAGNOSIS — M1030 Gout due to renal impairment, unspecified site: Secondary | ICD-10-CM | POA: Diagnosis not present

## 2024-03-04 DIAGNOSIS — G629 Polyneuropathy, unspecified: Secondary | ICD-10-CM | POA: Diagnosis not present

## 2024-03-04 DIAGNOSIS — I129 Hypertensive chronic kidney disease with stage 1 through stage 4 chronic kidney disease, or unspecified chronic kidney disease: Secondary | ICD-10-CM | POA: Diagnosis not present

## 2024-03-04 DIAGNOSIS — M47816 Spondylosis without myelopathy or radiculopathy, lumbar region: Secondary | ICD-10-CM | POA: Diagnosis not present

## 2024-03-04 DIAGNOSIS — M48061 Spinal stenosis, lumbar region without neurogenic claudication: Secondary | ICD-10-CM | POA: Diagnosis not present

## 2024-03-04 DIAGNOSIS — F32A Depression, unspecified: Secondary | ICD-10-CM | POA: Diagnosis not present

## 2024-03-04 DIAGNOSIS — M7138 Other bursal cyst, other site: Secondary | ICD-10-CM | POA: Diagnosis not present

## 2024-03-07 ENCOUNTER — Emergency Department
Admission: EM | Admit: 2024-03-07 | Discharge: 2024-03-07 | Disposition: A | Discharge status: Home / Self Care | Attending: Emergency Medicine | Admitting: Emergency Medicine

## 2024-03-07 ENCOUNTER — Emergency Department

## 2024-03-07 ENCOUNTER — Other Ambulatory Visit: Payer: Self-pay

## 2024-03-07 DIAGNOSIS — I130 Hypertensive heart and chronic kidney disease with heart failure and stage 1 through stage 4 chronic kidney disease, or unspecified chronic kidney disease: Secondary | ICD-10-CM | POA: Diagnosis not present

## 2024-03-07 DIAGNOSIS — I509 Heart failure, unspecified: Secondary | ICD-10-CM | POA: Insufficient documentation

## 2024-03-07 DIAGNOSIS — N183 Chronic kidney disease, stage 3 unspecified: Secondary | ICD-10-CM | POA: Diagnosis not present

## 2024-03-07 DIAGNOSIS — M79662 Pain in left lower leg: Secondary | ICD-10-CM | POA: Diagnosis not present

## 2024-03-07 DIAGNOSIS — M5416 Radiculopathy, lumbar region: Secondary | ICD-10-CM | POA: Diagnosis not present

## 2024-03-07 LAB — CBC WITH DIFFERENTIAL/PLATELET
Abs Immature Granulocytes: 0.04 10*3/uL (ref 0.00–0.07)
Basophils Absolute: 0 10*3/uL (ref 0.0–0.1)
Basophils Relative: 0 %
Eosinophils Absolute: 0.1 10*3/uL (ref 0.0–0.5)
Eosinophils Relative: 1 %
HCT: 35.5 % — ABNORMAL LOW (ref 36.0–46.0)
Hemoglobin: 10.9 g/dL — ABNORMAL LOW (ref 12.0–15.0)
Immature Granulocytes: 0 %
Lymphocytes Relative: 16 %
Lymphs Abs: 1.6 10*3/uL (ref 0.7–4.0)
MCH: 26.8 pg (ref 26.0–34.0)
MCHC: 30.7 g/dL (ref 30.0–36.0)
MCV: 87.4 fL (ref 80.0–100.0)
Monocytes Absolute: 0.5 10*3/uL (ref 0.1–1.0)
Monocytes Relative: 5 %
Neutro Abs: 7.8 10*3/uL — ABNORMAL HIGH (ref 1.7–7.7)
Neutrophils Relative %: 78 %
Platelets: 222 10*3/uL (ref 150–400)
RBC: 4.06 MIL/uL (ref 3.87–5.11)
RDW: 14.9 % (ref 11.5–15.5)
WBC: 10.1 10*3/uL (ref 4.0–10.5)
nRBC: 0 % (ref 0.0–0.2)

## 2024-03-07 LAB — BASIC METABOLIC PANEL WITH GFR
Anion gap: 3 — ABNORMAL LOW (ref 5–15)
BUN: 19 mg/dL (ref 8–23)
CO2: 34 mmol/L — ABNORMAL HIGH (ref 22–32)
Calcium: 9.1 mg/dL (ref 8.9–10.3)
Chloride: 102 mmol/L (ref 98–111)
Creatinine, Ser: 1.2 mg/dL — ABNORMAL HIGH (ref 0.44–1.00)
GFR, Estimated: 47 mL/min — ABNORMAL LOW (ref >60)
Glucose, Bld: 99 mg/dL (ref 70–99)
Potassium: 3.9 mmol/L (ref 3.5–5.1)
Sodium: 139 mmol/L (ref 135–145)

## 2024-03-07 LAB — URIC ACID: Uric Acid, Serum: 6 mg/dL (ref 2.5–7.1)

## 2024-03-07 MED ORDER — PREDNISONE 10 MG (21) PO TBPK: ORAL_TABLET | ORAL | 0 refills | Status: AC

## 2024-03-07 NOTE — ED Triage Notes (Signed)
 First nurse note:  Calf pain started a week ago, in left posterior calf, tenderness on palpation. Pt from [facility], sent over for DVT rule out.

## 2024-03-07 NOTE — ED Notes (Signed)
 Pt to ED 45--on assessment, pt has tenderness to the back of her knee that she is referring to as cramping. No redness or site swelling present. Generalized bilateral leg swelling present w/ hx of CHF. Respirations even but labored on exertion which pt states is baseline. A&O x4

## 2024-03-07 NOTE — ED Notes (Signed)
 Called lab for blood draw. Pt is on their list.

## 2024-03-07 NOTE — ED Triage Notes (Signed)
 Pt arrives from [facility] with c/o left calf pain that has been going on for about a week, calf is tender to the touch and hurts when weight bearing. Pt uses a walker normally. Pt is A&Ox4 during triage. Pt denies SOB, CP.

## 2024-03-07 NOTE — ED Provider Notes (Signed)
 [facility] Provider Note    Event Date/Time   First MD Initiated Contact with Patient 03/07/24 1042     (approximate)   History   Leg Pain   HPI Breanna Lawson is a 78 y.o. female presenting to the emergency department from [facility] clinic with reports of burning left calf pain x 1 week.  Patient was sent over for DVT rule out.  Patient states left calf is tender to touch and hurts when she is weightbearing on it.  Patient does use a walker with ambulation.  Denies shortness of breath, chest pain, weakness, dizziness, nausea, vomiting.  Patient does have a past medical history of CHF, polyarticular gout, stage III chronic kidney disease, hypertension, lumbar radiculopathy.      Physical Exam   Triage Vital Signs: ED Triage Vitals  Encounter Vitals Group     BP 03/07/24 1033 137/62     Systolic BP Percentile --      Diastolic BP Percentile --      Pulse Rate 03/07/24 1033 62     Resp 03/07/24 1033 18     Temp 03/07/24 1033 98.1 F (36.7 C)     Temp Source 03/07/24 1033 Oral     SpO2 03/07/24 1033 94 %     Weight 03/07/24 1035 281 lb (127.5 kg)     Height 03/07/24 1035 5\' 3"  (1.6 m)     Head Circumference --      Peak Flow --      Pain Score 03/07/24 1034 10     Pain Loc --      Pain Education --      Exclude from Growth Chart --     Most recent vital signs: Vitals:   03/07/24 1033  BP: 137/62  Pulse: 62  Resp: 18  Temp: 98.1 F (36.7 C)  SpO2: 94%    General: Well-appearing, in no acute distress. Appears stated age. Head: Normocephalic, atraumatic. CV: Regular rate, 62 bpm. Peripheral pulses 2+ and symmetric. B/l lower leg swelling, 1+ pitting edema. Respiratory: Breath sounds clear b/l. No wheezes, rales, or rhonchi. No respiratory distress. Normal respiratory effort. MSK: Normal ROM and strength 5/5 in bilateral lower extremities. No  obvious deformities.  Skin:Warm, dry, intact. No erythema or pallor, cyanosis. Brown  discoloration present b/l on lower legs, ankles, and feet. Neurological: A&Ox4 to person, place, time, and situation. Sensation intact to b/l lower extremities. Patient would not let me perform adequate straight leg raise exam due to fears of laying down on her back and increased difficulty breathing with her known CHF. Negative Homan's sign b/l.  ED Results / Procedures / Treatments   Labs (all labs ordered are listed, but only abnormal results are displayed) Labs Reviewed  CBC WITH DIFFERENTIAL/PLATELET - Abnormal; Notable for the following components:      Result Value   Hemoglobin 10.9 (*)    HCT 35.5 (*)    Neutro Abs 7.8 (*)    All other components within normal limits  BASIC METABOLIC PANEL WITH GFR - Abnormal; Notable for the following components:   CO2 34 (*)    Creatinine, Ser 1.20 (*)    GFR, Estimated 47 (*)    Anion gap 3 (*)    All other components within normal limits  URIC ACID     EKG     RADIOLOGY  Left lower extremity venous doppler US  ordered.  PROCEDURES:  Critical Care performed: No  Procedures   MEDICATIONS ORDERED  IN ED: Medications - No data to display   IMPRESSION / MDM / ASSESSMENT AND PLAN / ED COURSE  I reviewed the triage vital signs and the nursing notes.                              Differential diagnosis includes, but is not limited to, lumbar radiculopathy, stasis dermatitis, dependent edema from CHF, DVT, gout, cellulitis  Patient's presentation is most consistent with acute complicated illness / injury requiring diagnostic workup.  Patient is a 78 year old female presenting today with what is most likely lumbar radicular radiculopathy exacerbation.  Patient was sent from [facility] clinic and asked for venous ultrasound to rule out DVT of her left leg.  Reviewed those notes from today.  Venous ultrasound was ordered of left leg.  I independently reviewed the imaging and agree with the radiologist there are no acute findings  concerning for blood clot.  Also ordered CBC, BMP, and uric acid.  Uric acid is 6.0.  CBC with white blood cell count of 10.1, hgb 10.9.  BMP shows findings consistent with CKD already known, GFR estimate is 47 and creatinine 1.2, BUN is 19.  Discussed with patient how this is likely exacerbation of her prior lumbar radiculopathy.  Will prescribe her a 6-day taper pack of prednisone to help with this acute pain.  Emergency department return precautions were discussed with the patient.  Patient is in agreement to the treatment plan.  Patient is stable for discharge.      FINAL CLINICAL IMPRESSION(S) / ED DIAGNOSES   Final diagnoses:  Left lumbar radiculopathy     Rx / DC Orders   ED Discharge Orders          Ordered    predniSONE (STERAPRED UNI-PAK 21 TAB) 10 MG (21) TBPK tablet        03/07/24 1607             Note:  This document was prepared using Dragon voice recognition software and may include unintentional dictation errors.    Thomasenia Flesher, PA-C 03/07/24 1657    Lubertha Rush, MD 03/08/24 [phone]

## 2024-03-07 NOTE — Discharge Instructions (Addendum)
 You were seen in the emergency department for a pinched nerve from your back that goes down to your leg.  You do not have a blood clot, infection, or gout.  Please pick up and take the medication as prescribed.  Please return to the emergency department or call your primary care provider if you experience any fever, puslike discharge, redness, swelling, difficulty breathing, chest pain or any other new or concerning symptom.

## 2024-03-07 NOTE — ED Notes (Signed)
 Called Lab again for blood draw

## 2024-03-07 NOTE — ED Notes (Signed)
 Called lab for blood draw

## 2024-03-09 DIAGNOSIS — G629 Polyneuropathy, unspecified: Secondary | ICD-10-CM | POA: Diagnosis not present

## 2024-03-09 DIAGNOSIS — I129 Hypertensive chronic kidney disease with stage 1 through stage 4 chronic kidney disease, or unspecified chronic kidney disease: Secondary | ICD-10-CM | POA: Diagnosis not present

## 2024-03-09 DIAGNOSIS — N189 Chronic kidney disease, unspecified: Secondary | ICD-10-CM | POA: Diagnosis not present

## 2024-03-09 DIAGNOSIS — M1990 Unspecified osteoarthritis, unspecified site: Secondary | ICD-10-CM | POA: Diagnosis not present

## 2024-03-09 DIAGNOSIS — M48061 Spinal stenosis, lumbar region without neurogenic claudication: Secondary | ICD-10-CM | POA: Diagnosis not present

## 2024-03-09 DIAGNOSIS — M47816 Spondylosis without myelopathy or radiculopathy, lumbar region: Secondary | ICD-10-CM | POA: Diagnosis not present

## 2024-03-09 DIAGNOSIS — M1030 Gout due to renal impairment, unspecified site: Secondary | ICD-10-CM | POA: Diagnosis not present

## 2024-03-09 DIAGNOSIS — F32A Depression, unspecified: Secondary | ICD-10-CM | POA: Diagnosis not present

## 2024-03-09 DIAGNOSIS — M7138 Other bursal cyst, other site: Secondary | ICD-10-CM | POA: Diagnosis not present

## 2024-03-11 DIAGNOSIS — G629 Polyneuropathy, unspecified: Secondary | ICD-10-CM | POA: Diagnosis not present

## 2024-03-11 DIAGNOSIS — I129 Hypertensive chronic kidney disease with stage 1 through stage 4 chronic kidney disease, or unspecified chronic kidney disease: Secondary | ICD-10-CM | POA: Diagnosis not present

## 2024-03-11 DIAGNOSIS — F32A Depression, unspecified: Secondary | ICD-10-CM | POA: Diagnosis not present

## 2024-03-11 DIAGNOSIS — M1990 Unspecified osteoarthritis, unspecified site: Secondary | ICD-10-CM | POA: Diagnosis not present

## 2024-03-11 DIAGNOSIS — M48061 Spinal stenosis, lumbar region without neurogenic claudication: Secondary | ICD-10-CM | POA: Diagnosis not present

## 2024-03-11 DIAGNOSIS — M1030 Gout due to renal impairment, unspecified site: Secondary | ICD-10-CM | POA: Diagnosis not present

## 2024-03-11 DIAGNOSIS — M7138 Other bursal cyst, other site: Secondary | ICD-10-CM | POA: Diagnosis not present

## 2024-03-11 DIAGNOSIS — M47816 Spondylosis without myelopathy or radiculopathy, lumbar region: Secondary | ICD-10-CM | POA: Diagnosis not present

## 2024-03-11 DIAGNOSIS — N189 Chronic kidney disease, unspecified: Secondary | ICD-10-CM | POA: Diagnosis not present

## 2024-03-17 DIAGNOSIS — M5416 Radiculopathy, lumbar region: Secondary | ICD-10-CM | POA: Diagnosis not present

## 2024-03-17 DIAGNOSIS — Z0000 Encounter for general adult medical examination without abnormal findings: Secondary | ICD-10-CM | POA: Diagnosis not present

## 2024-03-17 DIAGNOSIS — R7303 Prediabetes: Secondary | ICD-10-CM | POA: Diagnosis not present

## 2024-03-17 DIAGNOSIS — Z1331 Encounter for screening for depression: Secondary | ICD-10-CM | POA: Diagnosis not present

## 2024-03-17 DIAGNOSIS — I129 Hypertensive chronic kidney disease with stage 1 through stage 4 chronic kidney disease, or unspecified chronic kidney disease: Secondary | ICD-10-CM | POA: Diagnosis not present

## 2024-03-17 DIAGNOSIS — E782 Mixed hyperlipidemia: Secondary | ICD-10-CM | POA: Diagnosis not present

## 2024-03-17 DIAGNOSIS — N1831 Chronic kidney disease, stage 3a: Secondary | ICD-10-CM | POA: Diagnosis not present

## 2024-03-17 DIAGNOSIS — Z1231 Encounter for screening mammogram for malignant neoplasm of breast: Secondary | ICD-10-CM | POA: Diagnosis not present

## 2024-03-18 ENCOUNTER — Other Ambulatory Visit: Payer: Self-pay | Admitting: Physician Assistant

## 2024-03-18 DIAGNOSIS — Z1231 Encounter for screening mammogram for malignant neoplasm of breast: Secondary | ICD-10-CM

## 2024-03-19 DIAGNOSIS — G629 Polyneuropathy, unspecified: Secondary | ICD-10-CM | POA: Diagnosis not present

## 2024-03-19 DIAGNOSIS — M48061 Spinal stenosis, lumbar region without neurogenic claudication: Secondary | ICD-10-CM | POA: Diagnosis not present

## 2024-03-19 DIAGNOSIS — M1030 Gout due to renal impairment, unspecified site: Secondary | ICD-10-CM | POA: Diagnosis not present

## 2024-03-19 DIAGNOSIS — M47816 Spondylosis without myelopathy or radiculopathy, lumbar region: Secondary | ICD-10-CM | POA: Diagnosis not present

## 2024-03-19 DIAGNOSIS — M7138 Other bursal cyst, other site: Secondary | ICD-10-CM | POA: Diagnosis not present

## 2024-03-19 DIAGNOSIS — I129 Hypertensive chronic kidney disease with stage 1 through stage 4 chronic kidney disease, or unspecified chronic kidney disease: Secondary | ICD-10-CM | POA: Diagnosis not present

## 2024-03-19 DIAGNOSIS — M1990 Unspecified osteoarthritis, unspecified site: Secondary | ICD-10-CM | POA: Diagnosis not present

## 2024-03-19 DIAGNOSIS — F32A Depression, unspecified: Secondary | ICD-10-CM | POA: Diagnosis not present

## 2024-03-19 DIAGNOSIS — N189 Chronic kidney disease, unspecified: Secondary | ICD-10-CM | POA: Diagnosis not present

## 2024-03-24 DIAGNOSIS — G8929 Other chronic pain: Secondary | ICD-10-CM | POA: Diagnosis not present

## 2024-03-24 DIAGNOSIS — M5442 Lumbago with sciatica, left side: Secondary | ICD-10-CM | POA: Diagnosis not present

## 2024-03-24 DIAGNOSIS — M5416 Radiculopathy, lumbar region: Secondary | ICD-10-CM | POA: Diagnosis not present

## 2024-03-26 DIAGNOSIS — M5416 Radiculopathy, lumbar region: Secondary | ICD-10-CM | POA: Diagnosis not present

## 2024-03-26 DIAGNOSIS — R7303 Prediabetes: Secondary | ICD-10-CM | POA: Diagnosis not present

## 2024-04-08 DIAGNOSIS — M5442 Lumbago with sciatica, left side: Secondary | ICD-10-CM | POA: Diagnosis not present

## 2024-04-08 DIAGNOSIS — M25473 Effusion, unspecified ankle: Secondary | ICD-10-CM | POA: Diagnosis not present

## 2024-04-08 DIAGNOSIS — G4733 Obstructive sleep apnea (adult) (pediatric): Secondary | ICD-10-CM | POA: Diagnosis not present

## 2024-04-08 DIAGNOSIS — N1831 Chronic kidney disease, stage 3a: Secondary | ICD-10-CM | POA: Diagnosis not present

## 2024-04-08 DIAGNOSIS — I959 Hypotension, unspecified: Secondary | ICD-10-CM | POA: Diagnosis not present

## 2024-04-08 DIAGNOSIS — I10 Essential (primary) hypertension: Secondary | ICD-10-CM | POA: Diagnosis not present

## 2024-04-08 DIAGNOSIS — I272 Pulmonary hypertension, unspecified: Secondary | ICD-10-CM | POA: Diagnosis not present

## 2024-04-08 DIAGNOSIS — M5416 Radiculopathy, lumbar region: Secondary | ICD-10-CM | POA: Diagnosis not present

## 2024-04-08 DIAGNOSIS — G8929 Other chronic pain: Secondary | ICD-10-CM | POA: Diagnosis not present

## 2024-04-08 DIAGNOSIS — K219 Gastro-esophageal reflux disease without esophagitis: Secondary | ICD-10-CM | POA: Diagnosis not present

## 2024-04-08 DIAGNOSIS — E782 Mixed hyperlipidemia: Secondary | ICD-10-CM | POA: Diagnosis not present

## 2024-04-14 DIAGNOSIS — B351 Tinea unguium: Secondary | ICD-10-CM | POA: Diagnosis not present

## 2024-04-14 DIAGNOSIS — M79675 Pain in left toe(s): Secondary | ICD-10-CM | POA: Diagnosis not present

## 2024-04-14 DIAGNOSIS — M79674 Pain in right toe(s): Secondary | ICD-10-CM | POA: Diagnosis not present

## 2024-04-14 DIAGNOSIS — F39 Unspecified mood [affective] disorder: Secondary | ICD-10-CM | POA: Diagnosis not present

## 2024-04-14 DIAGNOSIS — G629 Polyneuropathy, unspecified: Secondary | ICD-10-CM | POA: Diagnosis not present

## 2024-04-14 DIAGNOSIS — G4733 Obstructive sleep apnea (adult) (pediatric): Secondary | ICD-10-CM | POA: Diagnosis not present

## 2024-04-14 DIAGNOSIS — G8929 Other chronic pain: Secondary | ICD-10-CM | POA: Diagnosis not present

## 2024-04-14 DIAGNOSIS — R2689 Other abnormalities of gait and mobility: Secondary | ICD-10-CM | POA: Diagnosis not present

## 2024-04-14 DIAGNOSIS — M545 Low back pain, unspecified: Secondary | ICD-10-CM | POA: Diagnosis not present

## 2024-05-05 DIAGNOSIS — G4733 Obstructive sleep apnea (adult) (pediatric): Secondary | ICD-10-CM | POA: Diagnosis not present

## 2024-05-05 DIAGNOSIS — R531 Weakness: Secondary | ICD-10-CM | POA: Diagnosis not present

## 2024-05-05 DIAGNOSIS — J9611 Chronic respiratory failure with hypoxia: Secondary | ICD-10-CM | POA: Diagnosis not present

## 2024-05-05 DIAGNOSIS — Z789 Other specified health status: Secondary | ICD-10-CM | POA: Diagnosis not present

## 2024-05-05 DIAGNOSIS — Z6841 Body Mass Index (BMI) 40.0 and over, adult: Secondary | ICD-10-CM | POA: Diagnosis not present

## 2024-05-05 DIAGNOSIS — E669 Obesity, unspecified: Secondary | ICD-10-CM | POA: Diagnosis not present

## 2024-05-05 DIAGNOSIS — Z7409 Other reduced mobility: Secondary | ICD-10-CM | POA: Diagnosis not present

## 2024-05-06 DIAGNOSIS — N2581 Secondary hyperparathyroidism of renal origin: Secondary | ICD-10-CM | POA: Diagnosis not present

## 2024-05-06 DIAGNOSIS — N1832 Chronic kidney disease, stage 3b: Secondary | ICD-10-CM | POA: Diagnosis not present

## 2024-05-06 DIAGNOSIS — R8290 Unspecified abnormal findings in urine: Secondary | ICD-10-CM | POA: Diagnosis not present

## 2024-05-06 DIAGNOSIS — I129 Hypertensive chronic kidney disease with stage 1 through stage 4 chronic kidney disease, or unspecified chronic kidney disease: Secondary | ICD-10-CM | POA: Diagnosis not present

## 2024-05-06 DIAGNOSIS — D631 Anemia in chronic kidney disease: Secondary | ICD-10-CM | POA: Diagnosis not present

## 2024-05-08 DIAGNOSIS — R7303 Prediabetes: Secondary | ICD-10-CM | POA: Diagnosis not present

## 2024-05-08 DIAGNOSIS — M5416 Radiculopathy, lumbar region: Secondary | ICD-10-CM | POA: Diagnosis not present

## 2024-05-22 DIAGNOSIS — M5416 Radiculopathy, lumbar region: Secondary | ICD-10-CM | POA: Diagnosis not present

## 2024-05-22 DIAGNOSIS — G8929 Other chronic pain: Secondary | ICD-10-CM | POA: Diagnosis not present

## 2024-05-22 DIAGNOSIS — M5442 Lumbago with sciatica, left side: Secondary | ICD-10-CM | POA: Diagnosis not present

## 2024-06-03 DIAGNOSIS — N2581 Secondary hyperparathyroidism of renal origin: Secondary | ICD-10-CM | POA: Diagnosis not present

## 2024-06-03 DIAGNOSIS — I129 Hypertensive chronic kidney disease with stage 1 through stage 4 chronic kidney disease, or unspecified chronic kidney disease: Secondary | ICD-10-CM | POA: Diagnosis not present

## 2024-06-03 DIAGNOSIS — D631 Anemia in chronic kidney disease: Secondary | ICD-10-CM | POA: Diagnosis not present

## 2024-06-03 DIAGNOSIS — R8290 Unspecified abnormal findings in urine: Secondary | ICD-10-CM | POA: Diagnosis not present

## 2024-06-03 DIAGNOSIS — N1832 Chronic kidney disease, stage 3b: Secondary | ICD-10-CM | POA: Diagnosis not present

## 2024-07-22 DIAGNOSIS — M79675 Pain in left toe(s): Secondary | ICD-10-CM | POA: Diagnosis not present

## 2024-07-22 DIAGNOSIS — M79674 Pain in right toe(s): Secondary | ICD-10-CM | POA: Diagnosis not present

## 2024-07-22 DIAGNOSIS — B351 Tinea unguium: Secondary | ICD-10-CM | POA: Diagnosis not present

## 2024-09-07 ENCOUNTER — Encounter

## 2024-10-13 ENCOUNTER — Ambulatory Visit
Admission: RE | Admit: 2024-10-13 | Discharge: 2024-10-13 | Disposition: A | Discharge status: Home / Self Care | Source: Ambulatory Visit | Attending: Physician Assistant | Admitting: Physician Assistant

## 2024-10-13 DIAGNOSIS — Z1231 Encounter for screening mammogram for malignant neoplasm of breast: Secondary | ICD-10-CM | POA: Diagnosis present
